# Patient Record
Sex: Male | Born: 1950 | Race: Black or African American | Hispanic: No | Marital: Married | Smoking: Never smoker
Health system: Southern US, Community
[De-identification: ages and names within clinical notes are randomized; demographics above are authoritative.]

## PROBLEM LIST (undated history)

## (undated) DIAGNOSIS — K7400 Hepatic fibrosis, unspecified: Secondary | ICD-10-CM

## (undated) DIAGNOSIS — F32A Depression, unspecified: Secondary | ICD-10-CM

## (undated) DIAGNOSIS — I10 Essential (primary) hypertension: Secondary | ICD-10-CM

## (undated) DIAGNOSIS — Z789 Other specified health status: Secondary | ICD-10-CM

## (undated) DIAGNOSIS — I639 Cerebral infarction, unspecified: Secondary | ICD-10-CM

## (undated) DIAGNOSIS — J45909 Unspecified asthma, uncomplicated: Secondary | ICD-10-CM

## (undated) DIAGNOSIS — Z860100 Personal history of colon polyps, unspecified: Secondary | ICD-10-CM

## (undated) DIAGNOSIS — I499 Cardiac arrhythmia, unspecified: Secondary | ICD-10-CM

## (undated) DIAGNOSIS — I4510 Unspecified right bundle-branch block: Secondary | ICD-10-CM

## (undated) DIAGNOSIS — G4733 Obstructive sleep apnea (adult) (pediatric): Secondary | ICD-10-CM

## (undated) DIAGNOSIS — M1990 Unspecified osteoarthritis, unspecified site: Secondary | ICD-10-CM

## (undated) DIAGNOSIS — K5790 Diverticulosis of intestine, part unspecified, without perforation or abscess without bleeding: Secondary | ICD-10-CM

## (undated) DIAGNOSIS — I77819 Aortic ectasia, unspecified site: Secondary | ICD-10-CM

## (undated) DIAGNOSIS — R7303 Prediabetes: Secondary | ICD-10-CM

## (undated) DIAGNOSIS — Z87898 Personal history of other specified conditions: Secondary | ICD-10-CM

## (undated) DIAGNOSIS — N200 Calculus of kidney: Secondary | ICD-10-CM

## (undated) DIAGNOSIS — R768 Other specified abnormal immunological findings in serum: Secondary | ICD-10-CM

## (undated) DIAGNOSIS — I69351 Hemiplegia and hemiparesis following cerebral infarction affecting right dominant side: Secondary | ICD-10-CM

## (undated) DIAGNOSIS — K649 Unspecified hemorrhoids: Secondary | ICD-10-CM

## (undated) HISTORY — PX: COLONOSCOPY: SHX174

---

## 1981-01-22 HISTORY — PX: OTHER SURGICAL HISTORY: SHX169

## 2001-10-12 ENCOUNTER — Encounter: Payer: Self-pay | Admitting: Emergency Medicine

## 2001-10-12 ENCOUNTER — Emergency Department (HOSPITAL_COMMUNITY): Admission: EM | Admit: 2001-10-12 | Discharge: 2001-10-12 | Payer: Self-pay | Admitting: Emergency Medicine

## 2004-09-24 ENCOUNTER — Emergency Department (HOSPITAL_COMMUNITY): Admission: EM | Admit: 2004-09-24 | Discharge: 2004-09-24 | Payer: Self-pay | Admitting: Emergency Medicine

## 2007-06-24 ENCOUNTER — Encounter: Admission: RE | Admit: 2007-06-24 | Discharge: 2007-06-24 | Payer: Self-pay | Admitting: Family Medicine

## 2007-07-08 ENCOUNTER — Emergency Department (HOSPITAL_COMMUNITY): Admission: EM | Admit: 2007-07-08 | Discharge: 2007-07-08 | Payer: Self-pay | Admitting: Emergency Medicine

## 2009-06-16 ENCOUNTER — Encounter: Admission: RE | Admit: 2009-06-16 | Discharge: 2009-06-16 | Payer: Self-pay | Admitting: Family Medicine

## 2011-03-22 ENCOUNTER — Emergency Department: Payer: Self-pay | Admitting: Emergency Medicine

## 2012-01-08 ENCOUNTER — Other Ambulatory Visit: Payer: Self-pay | Admitting: Family Medicine

## 2012-01-08 DIAGNOSIS — R51 Headache: Secondary | ICD-10-CM

## 2012-01-11 ENCOUNTER — Ambulatory Visit
Admission: RE | Admit: 2012-01-11 | Discharge: 2012-01-11 | Disposition: A | Discharge status: Home / Self Care | Payer: PRIVATE HEALTH INSURANCE | Source: Ambulatory Visit | Attending: Family Medicine | Admitting: Family Medicine

## 2012-01-11 DIAGNOSIS — R51 Headache: Secondary | ICD-10-CM

## 2014-01-07 ENCOUNTER — Emergency Department (HOSPITAL_COMMUNITY)
Admission: EM | Admit: 2014-01-07 | Discharge: 2014-01-08 | Disposition: A | Discharge status: Home / Self Care | Payer: PRIVATE HEALTH INSURANCE | Attending: Emergency Medicine | Admitting: Emergency Medicine

## 2014-01-07 ENCOUNTER — Encounter (HOSPITAL_COMMUNITY): Payer: Self-pay | Admitting: Emergency Medicine

## 2014-01-07 DIAGNOSIS — Y9389 Activity, other specified: Secondary | ICD-10-CM | POA: Diagnosis not present

## 2014-01-07 DIAGNOSIS — Y92410 Unspecified street and highway as the place of occurrence of the external cause: Secondary | ICD-10-CM | POA: Insufficient documentation

## 2014-01-07 DIAGNOSIS — S39012A Strain of muscle, fascia and tendon of lower back, initial encounter: Secondary | ICD-10-CM | POA: Insufficient documentation

## 2014-01-07 DIAGNOSIS — S79912A Unspecified injury of left hip, initial encounter: Secondary | ICD-10-CM | POA: Diagnosis not present

## 2014-01-07 DIAGNOSIS — Y998 Other external cause status: Secondary | ICD-10-CM | POA: Insufficient documentation

## 2014-01-07 DIAGNOSIS — I10 Essential (primary) hypertension: Secondary | ICD-10-CM | POA: Insufficient documentation

## 2014-01-07 DIAGNOSIS — S3992XA Unspecified injury of lower back, initial encounter: Secondary | ICD-10-CM | POA: Diagnosis present

## 2014-01-07 HISTORY — DX: Essential (primary) hypertension: I10
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
## 2014-01-07 NOTE — ED Notes (Addendum)
Denis Neck pain.  NO c-collar in place

## 2014-01-08 ENCOUNTER — Emergency Department (HOSPITAL_COMMUNITY): Payer: PRIVATE HEALTH INSURANCE

## 2014-01-08 DIAGNOSIS — S39012A Strain of muscle, fascia and tendon of lower back, initial encounter: Secondary | ICD-10-CM | POA: Diagnosis not present

## 2014-01-08 MED ORDER — CYCLOBENZAPRINE HCL 10 MG PO TABS: 10.0000 mg | ORAL_TABLET | Freq: Two times a day (BID) | ORAL | Status: DC | PRN

## 2014-01-08 MED ORDER — NAPROXEN 500 MG PO TABS: 500.0000 mg | ORAL_TABLET | Freq: Two times a day (BID) | ORAL | Status: DC

## 2014-01-08 MED ORDER — TRAMADOL HCL 50 MG PO TABS: 50.0000 mg | ORAL_TABLET | Freq: Four times a day (QID) | ORAL | Status: DC | PRN

## 2014-01-08 MED ORDER — IBUPROFEN 400 MG PO TABS
800.0000 mg | ORAL_TABLET | Freq: Once | ORAL | Status: AC
  Administered 2014-01-08: 800 mg via ORAL
  Filled 2014-01-08: qty 2
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
## 2015-12-02 ENCOUNTER — Other Ambulatory Visit (HOSPITAL_COMMUNITY): Payer: Self-pay | Admitting: Nurse Practitioner

## 2015-12-02 DIAGNOSIS — B182 Chronic viral hepatitis C: Secondary | ICD-10-CM

## 2015-12-14 ENCOUNTER — Ambulatory Visit (HOSPITAL_COMMUNITY)
Admission: RE | Admit: 2015-12-14 | Discharge: 2015-12-14 | Disposition: A | Discharge status: Home / Self Care | Payer: No Typology Code available for payment source | Source: Ambulatory Visit | Attending: Nurse Practitioner | Admitting: Nurse Practitioner

## 2015-12-14 DIAGNOSIS — B182 Chronic viral hepatitis C: Secondary | ICD-10-CM

## 2016-01-02 ENCOUNTER — Ambulatory Visit (HOSPITAL_COMMUNITY): Payer: No Typology Code available for payment source

## 2016-01-11 ENCOUNTER — Ambulatory Visit (HOSPITAL_COMMUNITY)
Admission: RE | Admit: 2016-01-11 | Discharge: 2016-01-11 | Disposition: A | Discharge status: Home / Self Care | Payer: No Typology Code available for payment source | Source: Ambulatory Visit | Attending: Nurse Practitioner | Admitting: Nurse Practitioner

## 2016-01-11 DIAGNOSIS — N200 Calculus of kidney: Secondary | ICD-10-CM | POA: Insufficient documentation

## 2016-01-11 DIAGNOSIS — B182 Chronic viral hepatitis C: Secondary | ICD-10-CM | POA: Diagnosis present

## 2016-08-07 ENCOUNTER — Other Ambulatory Visit: Payer: Self-pay | Admitting: Nurse Practitioner

## 2016-08-07 DIAGNOSIS — K7400 Hepatic fibrosis, unspecified: Secondary | ICD-10-CM

## 2016-11-20 ENCOUNTER — Other Ambulatory Visit: Payer: Self-pay | Admitting: Nurse Practitioner

## 2016-11-20 DIAGNOSIS — K7400 Hepatic fibrosis, unspecified: Secondary | ICD-10-CM

## 2016-11-27 ENCOUNTER — Ambulatory Visit
Admission: RE | Admit: 2016-11-27 | Discharge: 2016-11-27 | Disposition: A | Discharge status: Home / Self Care | Payer: No Typology Code available for payment source | Source: Ambulatory Visit | Attending: Nurse Practitioner | Admitting: Nurse Practitioner

## 2016-11-27 DIAGNOSIS — K7400 Hepatic fibrosis, unspecified: Secondary | ICD-10-CM

## 2017-02-11 ENCOUNTER — Other Ambulatory Visit: Payer: Self-pay | Admitting: Nurse Practitioner

## 2017-02-11 DIAGNOSIS — B182 Chronic viral hepatitis C: Secondary | ICD-10-CM

## 2017-02-18 ENCOUNTER — Ambulatory Visit
Admission: RE | Admit: 2017-02-18 | Discharge: 2017-02-18 | Disposition: A | Discharge status: Home / Self Care | Payer: No Typology Code available for payment source | Source: Ambulatory Visit | Attending: Nurse Practitioner | Admitting: Nurse Practitioner

## 2017-02-18 DIAGNOSIS — B182 Chronic viral hepatitis C: Secondary | ICD-10-CM

## 2017-02-25 ENCOUNTER — Ambulatory Visit
Admission: RE | Admit: 2017-02-25 | Discharge: 2017-02-25 | Disposition: A | Discharge status: Home / Self Care | Payer: No Typology Code available for payment source | Source: Ambulatory Visit | Attending: Nurse Practitioner | Admitting: Nurse Practitioner

## 2017-02-25 DIAGNOSIS — B182 Chronic viral hepatitis C: Secondary | ICD-10-CM

## 2017-03-29 ENCOUNTER — Emergency Department (HOSPITAL_COMMUNITY)
Admission: EM | Admit: 2017-03-29 | Discharge: 2017-03-29 | Disposition: A | Discharge status: Home / Self Care | Payer: No Typology Code available for payment source | Attending: Emergency Medicine | Admitting: Emergency Medicine

## 2017-03-29 ENCOUNTER — Other Ambulatory Visit: Payer: Self-pay

## 2017-03-29 ENCOUNTER — Emergency Department (HOSPITAL_COMMUNITY): Payer: No Typology Code available for payment source

## 2017-03-29 ENCOUNTER — Encounter (HOSPITAL_COMMUNITY): Payer: Self-pay

## 2017-03-29 DIAGNOSIS — M79662 Pain in left lower leg: Secondary | ICD-10-CM | POA: Insufficient documentation

## 2017-03-29 DIAGNOSIS — I10 Essential (primary) hypertension: Secondary | ICD-10-CM | POA: Diagnosis not present

## 2017-03-29 DIAGNOSIS — M79605 Pain in left leg: Secondary | ICD-10-CM

## 2017-03-29 MED ORDER — DICLOFENAC SODIUM 75 MG PO TBEC: 75.0000 mg | DELAYED_RELEASE_TABLET | Freq: Two times a day (BID) | ORAL | 0 refills | Status: DC

## 2017-03-29 NOTE — ED Provider Notes (Signed)
[facility]   CSN: [id number] Arrival date & time: 03/29/17  1750     History   Chief Complaint Chief Complaint  Patient presents with  . Leg Pain    HPI Ethan Reed is a 67 y.o. male.  The history is provided by the patient.  Leg Pain   This is a new problem. The current episode started more than 1 week ago. The problem occurs constantly. The problem has been gradually worsening. The pain is present in the left lower leg. The quality of the pain is described as aching. Pertinent negatives include full range of motion. He has tried nothing for the symptoms. There has been no history of extremity trauma.  Pt complains of soreness in the front area of his lower leg.  Pt saw urgent care yesterday.  Pt given rx for meloxicam.  Pt was told he had muscle inflammation.    Past Medical History:  Diagnosis Date  . Hypertension     There are no active problems to display for this patient.   History reviewed. No pertinent surgical history.     Home Medications    Prior to Admission medications   Medication Sig Start Date End Date Taking? Authorizing Provider  cyclobenzaprine (FLEXERIL) 10 MG tablet Take 1 tablet (10 mg total) by mouth 2 (two) times daily as needed for muscle spasms. 01/08/14   Arthor CaptainHarris, Abigail, PA-C  diclofenac (VOLTAREN) 75 MG EC tablet Take 1 tablet (75 mg total) by mouth 2 (two) times daily. 03/29/17   Elson AreasSofia, Trenika Hudson K, PA-C  naproxen (NAPROSYN) 500 MG tablet Take 1 tablet (500 mg total) by mouth 2 (two) times daily with a meal. 01/08/14   Harris, Abigail, PA-C  traMADol (ULTRAM) 50 MG tablet Take 1 tablet (50 mg total) by mouth every 6 (six) hours as needed. 01/08/14   Arthor CaptainHarris, Abigail, PA-C    Family History History reviewed. No pertinent family history.  Social History Social History   Tobacco Use  . Smoking status: Never Smoker  . Smokeless tobacco: Never Used  Substance Use Topics  . Alcohol  use: No  . Drug use: No     Allergies   Oxycodone   Review of Systems Review of Systems  All other systems reviewed and are negative.    Physical Exam Updated Vital Signs BP (!) 144/91 (BP Location: Right Arm)   Pulse 73   Temp 98.1 F (36.7 C) (Oral)   Ht 6' (1.829 m)   Wt 88 kg (194 lb)   SpO2 98%   BMI 26.31 kg/m   Physical Exam  Constitutional: He appears well-developed and well-nourished.  HENT:  Head: Normocephalic.  Cardiovascular: Normal rate.  Pulmonary/Chest: Effort normal.  Musculoskeletal: He exhibits tenderness. He exhibits no edema or deformity.  Tender anterior shin,  No swelling, no bruising.  Negative homan's.    Neurological: He is alert.  Skin: Skin is warm.  Psychiatric: He has a normal mood and affect.  Nursing note and vitals reviewed.    ED Treatments / Results  Labs (all labs ordered are listed, but only abnormal results are displayed) Labs Reviewed - No data to display  EKG  EKG Interpretation None       Radiology Dg Tibia/fibula Left  Result Date: 03/29/2017 CLINICAL DATA:  Pt is having pain in left tib/fib, on lateral side from the ankle up to the knee. EXAM: LEFT TIBIA AND FIBULA - 2 VIEW COMPARISON:  None. FINDINGS: No  fracture. There is a small osteochondroma projecting from the posteromedial aspect of the proximal fibular metaphysis. No other bone lesions. There is medial joint space compartment narrowing at the knee consistent with mild osteoarthritis. The ankle joint is normally spaced and aligned. Soft tissues are unremarkable. IMPRESSION: 1. No fracture or acute finding. 2. Small proximal fibular osteochondroma. 3. Mild knee joint osteoarthritis. Electronically Signed   By: Amie Portland M.D.   On: 03/29/2017 19:23    Procedures Procedures (including critical care time)  Medications Ordered in ED Medications - No data to display   Initial Impression / Assessment and Plan / ED Course  I have reviewed the triage vital  signs and the nursing notes.  Pertinent labs & imaging results that were available during my care of the patient were reviewed by me and considered in my medical decision making (see chart for details).     MDM  xrays reviewed with pt,  I will try voltaren as meloxicam gave limited relief.  Pt advised to follow up with Orthopaedist for evalaution if pain persist.    Final Clinical Impressions(s) / ED Diagnoses   Final diagnoses:  Left leg pain    ED Discharge Orders        Ordered    diclofenac (VOLTAREN) 75 MG EC tablet  2 times daily     03/29/17 2013    An After Visit Summary was printed and given to the patient.    Elson Areas, PA-C 03/29/17 2247    Charlynne Pander, MD 04/01/17 [phone]

## 2017-03-29 NOTE — ED Notes (Signed)
Pt verbalizes understanding of d/c instructions. Pt received prescriptions. Pt ambulatory at d/c with all belongings.  

## 2017-03-29 NOTE — ED Notes (Signed)
See EDP assessment 

## 2017-11-05 ENCOUNTER — Ambulatory Visit
Admission: RE | Admit: 2017-11-05 | Discharge: 2017-11-05 | Disposition: A | Discharge status: Home / Self Care | Payer: No Typology Code available for payment source | Source: Ambulatory Visit | Attending: Family Medicine | Admitting: Family Medicine

## 2017-11-05 ENCOUNTER — Other Ambulatory Visit: Payer: Self-pay | Admitting: Family Medicine

## 2017-11-05 DIAGNOSIS — T1490XA Injury, unspecified, initial encounter: Secondary | ICD-10-CM

## 2018-01-22 DIAGNOSIS — I639 Cerebral infarction, unspecified: Secondary | ICD-10-CM

## 2018-01-22 HISTORY — DX: Cerebral infarction, unspecified: I63.9

## 2018-02-14 ENCOUNTER — Emergency Department (HOSPITAL_COMMUNITY): Payer: No Typology Code available for payment source

## 2018-02-14 ENCOUNTER — Observation Stay (HOSPITAL_COMMUNITY): Payer: No Typology Code available for payment source

## 2018-02-14 ENCOUNTER — Observation Stay (HOSPITAL_COMMUNITY)
Admission: EM | Admit: 2018-02-14 | Discharge: 2018-02-15 | Disposition: A | Discharge status: Home / Self Care | Payer: No Typology Code available for payment source | Attending: Internal Medicine | Admitting: Internal Medicine

## 2018-02-14 ENCOUNTER — Other Ambulatory Visit: Payer: Self-pay

## 2018-02-14 DIAGNOSIS — R0602 Shortness of breath: Secondary | ICD-10-CM | POA: Diagnosis not present

## 2018-02-14 DIAGNOSIS — I10 Essential (primary) hypertension: Secondary | ICD-10-CM

## 2018-02-14 DIAGNOSIS — I63332 Cerebral infarction due to thrombosis of left posterior cerebral artery: Secondary | ICD-10-CM

## 2018-02-14 DIAGNOSIS — E785 Hyperlipidemia, unspecified: Secondary | ICD-10-CM | POA: Insufficient documentation

## 2018-02-14 DIAGNOSIS — R531 Weakness: Secondary | ICD-10-CM

## 2018-02-14 DIAGNOSIS — R202 Paresthesia of skin: Secondary | ICD-10-CM

## 2018-02-14 DIAGNOSIS — Z79899 Other long term (current) drug therapy: Secondary | ICD-10-CM | POA: Insufficient documentation

## 2018-02-14 DIAGNOSIS — R200 Anesthesia of skin: Secondary | ICD-10-CM | POA: Diagnosis present

## 2018-02-14 DIAGNOSIS — G459 Transient cerebral ischemic attack, unspecified: Principal | ICD-10-CM

## 2018-02-14 HISTORY — DX: Other specified health status: Z78.9

## 2018-02-14 HISTORY — DX: Cardiac arrhythmia, unspecified: I49.9

## 2018-02-14 HISTORY — DX: Cerebral infarction, unspecified: I63.9

## 2018-02-14 LAB — CBC
HCT: 49 % (ref 39.0–52.0)
Hemoglobin: 16.3 g/dL (ref 13.0–17.0)
MCH: 30.4 pg (ref 26.0–34.0)
MCHC: 33.3 g/dL (ref 30.0–36.0)
MCV: 91.4 fL (ref 80.0–100.0)
NRBC: 0 % (ref 0.0–0.2)
PLATELETS: 184 10*3/uL (ref 150–400)
RBC: 5.36 MIL/uL (ref 4.22–5.81)
RDW: 12.5 % (ref 11.5–15.5)
WBC: 5.3 10*3/uL (ref 4.0–10.5)

## 2018-02-14 LAB — PROTIME-INR
INR: 1.02
Prothrombin Time: 13.3 seconds (ref 11.4–15.2)

## 2018-02-14 LAB — URINALYSIS, ROUTINE W REFLEX MICROSCOPIC
BACTERIA UA: NONE SEEN
Bilirubin Urine: NEGATIVE
GLUCOSE, UA: NEGATIVE mg/dL
Ketones, ur: NEGATIVE mg/dL
Leukocytes, UA: NEGATIVE
Nitrite: NEGATIVE
Protein, ur: NEGATIVE mg/dL
SPECIFIC GRAVITY, URINE: 1.012 (ref 1.005–1.030)
pH: 8 (ref 5.0–8.0)

## 2018-02-14 LAB — RAPID URINE DRUG SCREEN, HOSP PERFORMED
AMPHETAMINES: NOT DETECTED
Barbiturates: NOT DETECTED
Benzodiazepines: NOT DETECTED
Cocaine: NOT DETECTED
Opiates: NOT DETECTED
Tetrahydrocannabinol: NOT DETECTED

## 2018-02-14 LAB — DIFFERENTIAL
Abs Immature Granulocytes: 0.02 10*3/uL (ref 0.00–0.07)
Basophils Absolute: 0 10*3/uL (ref 0.0–0.1)
Basophils Relative: 1 %
Eosinophils Absolute: 0.1 10*3/uL (ref 0.0–0.5)
Eosinophils Relative: 1 %
Immature Granulocytes: 0 %
Lymphocytes Relative: 22 %
Lymphs Abs: 1.2 10*3/uL (ref 0.7–4.0)
Monocytes Absolute: 0.4 10*3/uL (ref 0.1–1.0)
Monocytes Relative: 8 %
Neutro Abs: 3.6 10*3/uL (ref 1.7–7.7)
Neutrophils Relative %: 68 %

## 2018-02-14 LAB — COMPREHENSIVE METABOLIC PANEL
ALT: 23 U/L (ref 0–44)
AST: 18 U/L (ref 15–41)
Albumin: 3.8 g/dL (ref 3.5–5.0)
Alkaline Phosphatase: 56 U/L (ref 38–126)
Anion gap: 10 (ref 5–15)
BUN: 12 mg/dL (ref 8–23)
CO2: 23 mmol/L (ref 22–32)
Calcium: 9.2 mg/dL (ref 8.9–10.3)
Chloride: 106 mmol/L (ref 98–111)
Creatinine, Ser: 0.93 mg/dL (ref 0.61–1.24)
GFR calc Af Amer: >60 mL/min (ref >60)
GFR calc non Af Amer: >60 mL/min (ref >60)
Glucose, Bld: 103 mg/dL — ABNORMAL HIGH (ref 70–99)
POTASSIUM: 3.6 mmol/L (ref 3.5–5.1)
SODIUM: 139 mmol/L (ref 135–145)
Total Bilirubin: 0.9 mg/dL (ref 0.3–1.2)
Total Protein: 7.6 g/dL (ref 6.5–8.1)

## 2018-02-14 LAB — I-STAT TROPONIN, ED: Troponin i, poc: 0 ng/mL (ref 0.00–0.08)

## 2018-02-14 LAB — ETHANOL: Alcohol, Ethyl (B): <10 mg/dL (ref <10)

## 2018-02-14 LAB — APTT: aPTT: 28 seconds (ref 24–36)

## 2018-02-14 MED ORDER — ACETAMINOPHEN 650 MG RE SUPP: 650.0000 mg | RECTAL | Status: DC | PRN

## 2018-02-14 MED ORDER — ACETAMINOPHEN 325 MG PO TABS: 650.0000 mg | ORAL_TABLET | ORAL | Status: DC | PRN

## 2018-02-14 MED ORDER — STROKE: EARLY STAGES OF RECOVERY BOOK
Freq: Once | Status: AC
  Administered 2018-02-14: 21:00:00
  Filled 2018-02-14: qty 1

## 2018-02-14 MED ORDER — ACETAMINOPHEN 160 MG/5ML PO SOLN: 650.0000 mg | ORAL | Status: DC | PRN

## 2018-02-14 MED ORDER — IOPAMIDOL (ISOVUE-370) INJECTION 76%
INTRAVENOUS | Status: AC
  Filled 2018-02-14: qty 100

## 2018-02-14 MED ORDER — HYDRALAZINE HCL 20 MG/ML IJ SOLN: 10.0000 mg | Freq: Four times a day (QID) | INTRAMUSCULAR | Status: DC | PRN

## 2018-02-14 MED ORDER — IOPAMIDOL (ISOVUE-370) INJECTION 76%
100.0000 mL | Freq: Once | INTRAVENOUS | Status: AC | PRN
  Administered 2018-02-14: 100 mL via INTRAVENOUS

## 2018-02-14 MED ORDER — ASPIRIN EC 325 MG PO TBEC
325.0000 mg | DELAYED_RELEASE_TABLET | Freq: Every day | ORAL | Status: DC
  Administered 2018-02-14: 325 mg via ORAL
  Filled 2018-02-14: qty 1

## 2018-02-14 MED ORDER — SENNOSIDES-DOCUSATE SODIUM 8.6-50 MG PO TABS: 1.0000 | ORAL_TABLET | Freq: Every evening | ORAL | Status: DC | PRN

## 2018-02-14 NOTE — Consult Note (Addendum)
Neurology Consultation  Reason for Consult: TIA Referring Physician: Dr. Catha GosselinMikhail  CC: Right face and arm tingling and numbness  History is obtained from: Patient and wife  HPI: Ethan EvansWilliam B Reed is a 68 y.o. left-handed male past medical history of hypertension, hyperlipidemia, occasionally compliant with his hypertensive medications, presented to the emergency room for evaluation of numbness and also possible weakness in the right arm that started upon waking up this morning. Best last known normal is around 9 AM but symptoms completely resolving and coming back around. He describes that he woke up this morning, was feeling okay and then started to feel somewhat not like himself which he describes as feeling dizzy and just not right. The dizziness he describes as being lightheadedness that lasted for a few minutes and then resolved.  He also noted at the time that he was having difficulty using his right arm.  His right arm felt lighter than usual and he could not coordinate the movements as well. The symptoms nearly completely resolved and then recurred.  The first time the symptoms lasted for about 25 minutes and then completely resolved.  Again the symptoms started happening and lasted for about 2030 minutes and then completely resolved.  During the time of this encounter, he was complaining of having the symptoms again and my exam as documented below- low NIH stroke scale at most 2 for ataxia hence not a candidate for any acute intervention including TPA. On review of systems, he denies any shortness of breath chest pain nausea vomiting bleeding or bruising tendencies.  He denies any preceding illnesses sicknesses fevers chills.  Initially denied anxiety but then later on after my exam was completed, his wife and him both asked that the symptoms be coming from anxiety as he is under some personal stressors, which are nearly the same as his usual stressors.  He has been more concerned with them  then usual.  LKW: 9 AM 02/14/2017 tpa given?: no, NIHSS 2 at best, waxing waning symptoms Premorbid modified Rankin scale (mRS): 0  ROS: ROS was performed and is negative except as noted in the HPI.  Past Medical History:  Diagnosis Date  . Hypertension    No family history on file.  Social History:   reports that he has never smoked. He has never used smokeless tobacco. He reports that he does not drink alcohol or use drugs.  Medications No current facility-administered medications for this encounter.   Current Outpatient Medications:  .  cyclobenzaprine (FLEXERIL) 10 MG tablet, Take 1 tablet (10 mg total) by mouth 2 (two) times daily as needed for muscle spasms., Disp: 20 tablet, Rfl: 0 .  diclofenac (VOLTAREN) 75 MG EC tablet, Take 1 tablet (75 mg total) by mouth 2 (two) times daily., Disp: 20 tablet, Rfl: 0 .  naproxen (NAPROSYN) 500 MG tablet, Take 1 tablet (500 mg total) by mouth 2 (two) times daily with a meal., Disp: 30 tablet, Rfl: 0 .  traMADol (ULTRAM) 50 MG tablet, Take 1 tablet (50 mg total) by mouth every 6 (six) hours as needed., Disp: 15 tablet, Rfl: 0  Exam: Current vital signs: BP (!) 165/101   Pulse 91   Temp 98 F (36.7 C) (Oral)   Resp 12   Ht 6' (1.829 m)   Wt 86.2 kg   SpO2 96%   BMI 25.77 kg/m  Vital signs in last 24 hours: Temp:  [98 F (36.7 C)] 98 F (36.7 C) (01/24 1259) Pulse Rate:  [91-95] 91 (  01/24 1500) Resp:  [12-16] 12 (01/24 1500) BP: (165)/(101) 165/101 (01/24 1300) SpO2:  [96 %-97 %] 96 % (01/24 1500) Weight:  [86.2 kg] 86.2 kg (01/24 1256) GENERAL: Awake, alert in NAD HEENT: - Normocephalic and atraumatic, dry mm, no LN++, no Thyromegally LUNGS - Clear to auscultation bilaterally with no wheezes CV - S1S2 RRR, no m/r/g, equal pulses bilaterally. ABDOMEN - Soft, nontender, nondistended with normoactive BS Ext: warm, well perfused, intact peripheral pulses,no edema NEURO:  Mental Status: AA&Ox3  Language: speech is not  dysarthric.  Naming, repetition, fluency, and comprehension intact. Cranial Nerves: PERRL EOMI, visual fields full, no facial asymmetry,facial sensation - decreased to LT on right lower face, hearing intact, tongue/uvula/soft palate midline, normal sternocleidomastoid and trapezius muscle strength. No evidence of tongue atrophy or fibrillations Motor: 5/5 all over with no drift Tone: is normal and bulk is normal Sensation- Intact to light touch bilaterally Coordination: FTN shows ataxia on right, possibly mildly ataxic on right HKS also. No dysmetria on left side Gait- deferred  NIHSS-2 at this time   Labs I have reviewed labs in epic and the results pertinent to this consultation are:  CBC    Component Value Date/Time   WBC 5.3 02/14/2018 1443   RBC 5.36 02/14/2018 1443   HGB 16.3 02/14/2018 1443   HCT 49.0 02/14/2018 1443   PLT 184 02/14/2018 1443   MCV 91.4 02/14/2018 1443   MCH 30.4 02/14/2018 1443   MCHC 33.3 02/14/2018 1443   RDW 12.5 02/14/2018 1443   LYMPHSABS 1.2 02/14/2018 1443   MONOABS 0.4 02/14/2018 1443   EOSABS 0.1 02/14/2018 1443   BASOSABS 0.0 02/14/2018 1443    CMP     Component Value Date/Time   NA 139 02/14/2018 1443   K 3.6 02/14/2018 1443   CL 106 02/14/2018 1443   CO2 23 02/14/2018 1443   GLUCOSE 103 (H) 02/14/2018 1443   BUN 12 02/14/2018 1443   CREATININE 0.93 02/14/2018 1443   CALCIUM 9.2 02/14/2018 1443   PROT 7.6 02/14/2018 1443   ALBUMIN 3.8 02/14/2018 1443   AST 18 02/14/2018 1443   ALT 23 02/14/2018 1443   ALKPHOS 56 02/14/2018 1443   BILITOT 0.9 02/14/2018 1443   GFRNONAA >60 02/14/2018 1443   GFRAA >60 02/14/2018 1443   Imaging I have reviewed the images obtained: CT-scan of the brain-noa cute changes. No bleed.  Assessment: 68 year old left-handed man with history of hypertension hyperlipidemia noncompliant to medications presented for evaluation and numbness and possible weakness in the right arm and numbness of the right face  that has been going off and on since 9 AM. His NIH stroke scale for me was 2.  Mild symptoms hence no TPA warranted or offered. No cortical signs on exam.  Not a candidate for EVT. Differentials due to the risk factors include evaluate for stroke/crescendo TIA versus anxiety (both patient and wife volunteered that there are some extenuating stressors both personally and workwise) Given his risk factors, I would evaluate him for stroke/TIA. On my initial conversation with the ED provider/internal medicine attending, I had recommended getting an EEG but this does not seem to be an electrographic phenomenon and I would not pursue a seizure work-up at this time.  Impression:  Evaluate for stroke/TIA Anxiety  Recommendations: -Telemetry -Frequent neurochecks -Allow for permissive hypertension treating on the systolic blood pressures more than 220. -Aspirin 325 -Atorvastatin 80 -2D echo -A1c -Lipid panel -MRI brain -CTA head and neck -PT -OT -Speech therapy  For  anxiety, recommended that he speak with primary care and get help as deemed appropriate.  He did not feel comfortable to discuss with me the details of his stressors at this time.  Please page stroke NP/PA/MD (listed on AMION)  from 8am-4 pm as this patient will be followed by the stroke team at this point.  -- Milon Dikes, MD [facility] Pager: [phone] If 7pm to 7am, please call on call as listed on AMION.

## 2018-02-14 NOTE — Progress Notes (Signed)
Patient arrived to unit, VSS, NAD noted, MDl notified, no new orders received. PIV access obtained for CTA, pt ok to transport w/o RN per Dr. Catha Gosselin.

## 2018-02-14 NOTE — ED Provider Notes (Signed)
[name] Provider Note   CSN: [id number] Arrival date & time: 02/14/18  1248     History   Chief Complaint Chief Complaint  Patient presents with  . Right Arm Numbness    HPI Ethan EvansWilliam B Reed is a 68 y.o. male with a PMHx of HTN, who presents to the ED with complaints of right facial and arm numbness and tingling intermittently since 11 AM.  Patient states he has brief episodes lasting a few minutes where his right mouth will feel tingly and numb as well as his right hand, these are intermittent.  He states that around 11 AM when it started he had right arm and leg weakness/discoordination that was also intermittent.  He states he had some difficulty with his speech, felt that it was slurred, and also felt dizzy.  These all are intermittent "episodes" and last only a few minutes before self resolving.  No known aggravating or alleviating factors, no treatments were tried.  He states that he is "breathing a little heavy" and feels "a little" short of breath.  Lastly he mentions that for this last week he has had some diarrhea, nonbloody, but none today.  He mentions that he has not taken his blood pressure medication today, takes lisinopril-HCTZ 10-12.5 mg, but admits that he has not been taking it as prescribed every day.  His PCP is Dr. [name] at [facility].  He denies any facial droop, fevers, chills, cough, chest pain, leg swelling, abdominal pain, nausea, vomiting, constipation, melena, hematochezia, dysuria, hematuria, headache, vision changes, diaphoresis, or any other complaints at this time.  The history is provided by the patient and medical records. No language interpreter was used.    Past Medical History:  Diagnosis Date  . Hypertension     There are no active problems to display for this patient.   No past surgical history on file.      Home Medications    Prior to Admission medications   Medication Sig Start Date  End Date Taking? Authorizing Provider  cyclobenzaprine (FLEXERIL) 10 MG tablet Take 1 tablet (10 mg total) by mouth 2 (two) times daily as needed for muscle spasms. 01/08/14   Arthor CaptainHarris, Abigail, PA-C  diclofenac (VOLTAREN) 75 MG EC tablet Take 1 tablet (75 mg total) by mouth 2 (two) times daily. 03/29/17   Elson AreasSofia, Leslie K, PA-C  naproxen (NAPROSYN) 500 MG tablet Take 1 tablet (500 mg total) by mouth 2 (two) times daily with a meal. 01/08/14   Harris, Abigail, PA-C  traMADol (ULTRAM) 50 MG tablet Take 1 tablet (50 mg total) by mouth every 6 (six) hours as needed. 01/08/14   Arthor CaptainHarris, Abigail, PA-C    Family History No family history on file.  Social History Social History   Tobacco Use  . Smoking status: Never Smoker  . Smokeless tobacco: Never Used  Substance Use Topics  . Alcohol use: No  . Drug use: No     Allergies   Oxycodone   Review of Systems Review of Systems  Constitutional: Negative for chills, diaphoresis and fever.  Eyes: Negative for visual disturbance.  Respiratory: Positive for shortness of breath. Negative for cough.   Cardiovascular: Negative for chest pain and leg swelling.  Gastrointestinal: Positive for diarrhea (past week, none today). Negative for abdominal pain, blood in stool, constipation, nausea and vomiting.  Genitourinary: Negative for dysuria and hematuria.  Musculoskeletal: Negative for arthralgias and myalgias.  Skin: Negative for color change.  Allergic/Immunologic: Negative for  immunocompromised state.  Neurological: Positive for dizziness, speech difficulty, weakness and numbness. Negative for headaches.  Psychiatric/Behavioral: Negative for confusion.   All other systems reviewed and are negative for acute change except as noted in the HPI.    Physical Exam Updated Vital Signs BP (!) 165/101   Pulse 95   Temp 98 F (36.7 C) (Oral)   Resp 16   Ht 6' (1.829 m)   Wt 86.2 kg   SpO2 97%   BMI 25.77 kg/m   Physical Exam Vitals signs and  nursing note reviewed.  Constitutional:      General: He is not in acute distress.    Appearance: Normal appearance. He is well-developed. He is not toxic-appearing.     Comments: Afebrile, nontoxic, NAD  HENT:     Head: Normocephalic and atraumatic.  Eyes:     General: Vision grossly intact.        Right eye: No discharge.        Left eye: No discharge.     Extraocular Movements: Extraocular movements intact.     Conjunctiva/sclera: Conjunctivae normal.     Pupils: Pupils are equal, round, and reactive to light.     Comments: PERRL, EOMI, no nystagmus  Neck:     Musculoskeletal: Normal range of motion and neck supple. Normal range of motion. No neck rigidity, spinous process tenderness or muscular tenderness.     Comments: FROM intact without spinous process TTP, no bony stepoffs or deformities, no paraspinous muscle TTP or muscle spasms. No rigidity or meningeal signs. No bruising or swelling.  Cardiovascular:     Rate and Rhythm: Normal rate and regular rhythm.     Pulses: Normal pulses.     Heart sounds: Normal heart sounds, S1 normal and S2 normal. No murmur. No friction rub. No gallop.   Pulmonary:     Effort: Pulmonary effort is normal. No respiratory distress.     Breath sounds: Normal breath sounds. No decreased breath sounds, wheezing, rhonchi or rales.     Comments: CTAB in all lung fields, no w/r/r, no hypoxia or increased WOB, speaking in full sentences, SpO2 96% on RA Abdominal:     General: Bowel sounds are normal. There is no distension.     Palpations: Abdomen is soft. Abdomen is not rigid.     Tenderness: There is no abdominal tenderness. There is no right CVA tenderness, left CVA tenderness, guarding or rebound. Negative signs include Murphy's sign and McBurney's sign.  Musculoskeletal: Normal range of motion.     Comments: MAE x4 Strength and sensation grossly intact in all extremities Distal pulses intact Gait steady No pedal edema  Skin:    General: Skin is  warm and dry.     Findings: No rash.  Neurological:     General: No focal deficit present.     Mental Status: He is alert and oriented to person, place, and time.     GCS: GCS eye subscore is 4. GCS verbal subscore is 5. GCS motor subscore is 6.     Cranial Nerves: Cranial nerves are intact. No cranial nerve deficit.     Sensory: Sensation is intact. No sensory deficit.     Motor: Motor function is intact.     Coordination: Coordination is intact. Coordination normal.     Gait: Gait normal.     Comments: CN 2-12 grossly intact A&O x4 GCS 15 Sensation and strength intact Gait nonataxic including with tandem walking Coordination with finger-to-nose WNL Neg pronator  drift   Psychiatric:        Mood and Affect: Mood and affect normal.        Behavior: Behavior normal.      ED Treatments / Results  Labs (all labs ordered are listed, but only abnormal results are displayed) Labs Reviewed  COMPREHENSIVE METABOLIC PANEL - Abnormal; Notable for the following components:      Result Value   Glucose, Bld 103 (*)    All other components within normal limits  ETHANOL  PROTIME-INR  APTT  CBC  DIFFERENTIAL  RAPID URINE DRUG SCREEN, HOSP PERFORMED  URINALYSIS, ROUTINE W REFLEX MICROSCOPIC  I-STAT TROPONIN, ED    EKG EKG Interpretation  Date/Time:  Friday February 14 2018 14:55:56 EST Ventricular Rate:  90 PR Interval:    QRS Duration: 108 QT Interval:  355 QTC Calculation: 435 R Axis:   84 Text Interpretation:  Sinus rhythm Consider right ventricular hypertrophy No old tracing to compare Confirmed by Raeford Razor [phone]) on 02/14/2018 3:11:46 PM   Radiology Dg Chest 2 View  Result Date: 02/14/2018 CLINICAL DATA:  Lip numbness and right hand and leg numbness with discoordination of the right hand today. EXAM: CHEST - 2 VIEW COMPARISON:  None. FINDINGS: Normal sized heart. Tortuous aorta. The lungs are hyperexpanded with mild peribronchial thickening. Thoracic spine  degenerative changes. IMPRESSION: No acute abnormality. Mild changes of COPD and chronic bronchitis. Electronically Signed   By: Beckie Salts M.D.   On: 02/14/2018 15:43   Ct Head Wo Contrast  Result Date: 02/14/2018 CLINICAL DATA:  Shortness of breath and intermittent numbness and tingling in right arm. EXAM: CT HEAD WITHOUT CONTRAST TECHNIQUE: Contiguous axial images were obtained from the base of the skull through the vertex without intravenous contrast. COMPARISON:  01/11/2012 FINDINGS: Brain: The ventricles are normal in size and configuration. No extra-axial fluid collections are identified. The gray-white differentiation is maintained. No CT findings for acute hemispheric infarction or intracranial hemorrhage. No mass lesions. The brainstem and cerebellum are normal. Vascular: No hyperdense vessels or obvious aneurysm. Skull: No acute skull fracture.  No bone lesion. Sinuses/Orbits: The paranasal sinuses and mastoid air cells are clear. The globes are intact. Other: No scalp lesions, laceration or hematoma. IMPRESSION: Normal head CT. Electronically Signed   By: Rudie Meyer M.D.   On: 02/14/2018 15:19    Procedures Procedures (including critical care time)  Medications Ordered in ED Medications - No data to display   Initial Impression / Assessment and Plan / ED Course  I have reviewed the triage vital signs and the nursing notes.  Pertinent labs & imaging results that were available during my care of the patient were reviewed by me and considered in my medical decision making (see chart for details).     68 y.o. male here with intermittent brief episodes of tingling/paresthesias to R mouth and arm, weakness/discoordination of R arm/leg, slurred speech, and dizziness. Also having mild SOB. On exam, clear lung exam, no pedal edema, no tachycardia or hypoxia, no focal neuro deficits. States they occur in brief episodes lasting a few minutes and resolving. Nothing focal on exam during  these episodes. Will get stroke like work up, but doubt need for calling code stroke at this time. Will reassess shortly. Discussed case with my attending Dr. Juleen China who agrees with plan.   4:09 PM CBC w/diff WNL. CMP unremarkable. Trop neg. INR WNL. APTT WNL. EtOH level undetectable. CXR without acute findings, shows changes of COPD and chronic bronchitis but  nothing acute. CT head negative. EKG without acute ischemic findings. Spoke with Dr. Otelia LimesLindzen who is here seeing another patient, he agrees that this could represent TIAs and feels it's prudent to admit for TIA work up. Will formally consult his colleague Dr. Wilford CornerArora as well, to get neurology formally on board with this patient. Will proceed with admission.   4:17 PM Dr. Catha GosselinMikhail and [facility] returning page and will admit/consult on patient. Holding orders to be placed by admitting team. Please see their notes for further documentation of care. I appreciate their help with this pleasant pt's care. Pt stable at time of admission.    Final Clinical Impressions(s) / ED Diagnoses   Final diagnoses:  Paresthesias  Right sided weakness  TIA (transient ischemic attack)  SOB (shortness of breath)    ED Discharge Orders    [address]None       Leeza Heiner, [place]cedes, [place]PA-C 02/14/18 1619    Raeford RazorKohut, Stephen, MD 02/15/18 [phone]

## 2018-02-14 NOTE — ED Triage Notes (Addendum)
Pt BIB [facility]. EMS reports that patient was having some shortness of breath this morning when he woke up around 0900. Pt reports that around 1100 he began to have intermittent numbness in his right arm and right side of his face. Pt states "I think I am having a stroke."

## 2018-02-14 NOTE — H&P (Signed)
[facility] History and Physical  Ethan Reed [id number]:[id number] DOB: 10-05-1950 DOA: 02/14/2018  PCP: Mila PalmerWolters, Sharon, MD  Patient coming from: home  Chief Complaint: Right sided weakness and numbness  HPI: Ethan EvansWilliam B Reed is a 68 y.o. male with a medical history of hypertension, who presented to the emergency department with complaints of right-sided weakness and numbness which started today on 11 AM.  He states he has been having intermittent episodes lasting approximately 25 minutes of right arm and leg weakness as well as discoordination with his right arm.  He has felt some numbness in his fingertips as well.  Patient also states he had some slurred speech this morning along with dizziness.  He denied any chest pain, shortness of breath, abdominal pain, nausea or vomiting, diarrhea or constipation, problems with dysuria, hematuria, headache, visual changes, neck pain, recent illness or travel.  He does admit to not taking his medications for blood pressure as directed.  ED Course: CT head unremarkable.  Neurology consulted, TR[facility] asked to admit  Review of Systems:  All other systems reviewed and are negative.   Past Medical History:  Diagnosis Date  . Hypertension     No past surgical history on file.  Social History:  reports that he has never smoked. He has never used smokeless tobacco. He reports that he does not drink alcohol or use drugs.   Allergies  Allergen Reactions  . Oxycodone Nausea And Vomiting and Other (See Comments)    Dizziness    No family history on file.  No family history of heart disease or diabetes  Prior to Admission medications   Medication Sig Start Date End Date Taking? Authorizing Provider  cyclobenzaprine (FLEXERIL) 10 MG tablet Take 1 tablet (10 mg total) by mouth 2 (two) times daily as needed for muscle spasms. 01/08/14   Arthor CaptainHarris, Abigail, PA-C  diclofenac (VOLTAREN) 75 MG EC tablet Take 1 tablet (75 mg total) by mouth 2 (two) times  daily. 03/29/17   Elson AreasSofia, Leslie K, PA-C  naproxen (NAPROSYN) 500 MG tablet Take 1 tablet (500 mg total) by mouth 2 (two) times daily with a meal. 01/08/14   Harris, Abigail, PA-C  traMADol (ULTRAM) 50 MG tablet Take 1 tablet (50 mg total) by mouth every 6 (six) hours as needed. 01/08/14   Arthor CaptainHarris, Abigail, PA-C    Physical Exam: Vitals:   02/14/18 1300 02/14/18 1500  BP: (!) 165/101   Pulse: 95 91  Resp:  12  Temp:    SpO2: 97% 96%     General: Well developed, well nourished, NAD, appears stated age  HEENT: NCAT, PERRLA, EOMI, Anicteic Sclera, mucous membranes moist.   Neck: Supple, no JVD, no masses  Cardiovascular: S1 S2 auscultated, no rubs, murmurs or gallops. Regular rate and rhythm.  Respiratory: Clear to auscultation bilaterally with equal chest rise  Abdomen: Soft, nontender, nondistended, + bowel sounds  Extremities: warm dry without cyanosis clubbing or edema  Neuro: AAOx3, cranial nerves grossly intact. Strength 5/5 in patient's upper and lower extremities bilaterally, right upper extremity dysmetria with finger-to-nose testing  Skin: Without rashes exudates or nodules  Psych: Normal affect and demeanor with intact judgement and insight  Labs on Admission: I have personally reviewed following labs and imaging studies CBC: Recent Labs  Lab 02/14/18 1443  WBC 5.3  NEUTROABS 3.6  HGB 16.3  HCT 49.0  MCV 91.4  PLT 184   Basic Metabolic Panel: Recent Labs  Lab 02/14/18 1443  NA 139  K 3.6  CL 106  CO2 23  GLUCOSE 103*  BUN 12  CREATININE 0.93  CALCIUM 9.2   GFR: Estimated Creatinine Clearance: 86.9 mL/min (by C-G formula based on SCr of 0.93 mg/dL). Liver Function Tests: Recent Labs  Lab 02/14/18 1443  AST 18  ALT 23  ALKPHOS 56  BILITOT 0.9  PROT 7.6  ALBUMIN 3.8   No results for input(s): LIPASE, AMYLASE in the last 168 hours. No results for input(s): AMMONIA in the last 168 hours. Coagulation Profile: Recent Labs  Lab 02/14/18 1443    INR 1.02   Cardiac Enzymes: No results for input(s): CKTOTAL, CKMB, CKMBINDEX, TROPONINI in the last 168 hours. BNP (last 3 results) No results for input(s): PROBNP in the last 8760 hours. HbA1C: No results for input(s): HGBA1C in the last 72 hours. CBG: No results for input(s): GLUCAP in the last 168 hours. Lipid Profile: No results for input(s): CHOL, HDL, LDLCALC, TRIG, CHOLHDL, LDLDIRECT in the last 72 hours. Thyroid Function Tests: No results for input(s): TSH, T4TOTAL, FREET4, T3FREE, THYROIDAB in the last 72 hours. Anemia Panel: No results for input(s): VITAMINB12, FOLATE, FERRITIN, TIBC, IRON, RETICCTPCT in the last 72 hours. Urine analysis:    Component Value Date/Time   COLORURINE YELLOW 02/14/2018 1551   APPEARANCEUR CLEAR 02/14/2018 1551   LABSPEC 1.012 02/14/2018 1551   PHURINE 8.0 02/14/2018 1551   GLUCOSEU NEGATIVE 02/14/2018 1551   HGBUR SMALL (A) 02/14/2018 1551   BILIRUBINUR NEGATIVE 02/14/2018 1551   KETONESUR NEGATIVE 02/14/2018 1551   PROTEINUR NEGATIVE 02/14/2018 1551   NITRITE NEGATIVE 02/14/2018 1551   LEUKOCYTESUR NEGATIVE 02/14/2018 1551   Sepsis Labs: @LABRCNTIP (procalcitonin:4,lacticidven:4) )No results found for this or any previous visit (from the past 240 hour(s)).   Radiological Exams on Admission: Dg Chest 2 View  Result Date: 02/14/2018 CLINICAL DATA:  Lip numbness and right hand and leg numbness with discoordination of the right hand today. EXAM: CHEST - 2 VIEW COMPARISON:  None. FINDINGS: Normal sized heart. Tortuous aorta. The lungs are hyperexpanded with mild peribronchial thickening. Thoracic spine degenerative changes. IMPRESSION: No acute abnormality. Mild changes of COPD and chronic bronchitis. Electronically Signed   By: Beckie SaltsSteven  Reid M.D.   On: 02/14/2018 15:43   Ct Head Wo Contrast  Result Date: 02/14/2018 CLINICAL DATA:  Shortness of breath and intermittent numbness and tingling in right arm. EXAM: CT HEAD WITHOUT CONTRAST  TECHNIQUE: Contiguous axial images were obtained from the base of the skull through the vertex without intravenous contrast. COMPARISON:  01/11/2012 FINDINGS: Brain: The ventricles are normal in size and configuration. No extra-axial fluid collections are identified. The gray-white differentiation is maintained. No CT findings for acute hemispheric infarction or intracranial hemorrhage. No mass lesions. The brainstem and cerebellum are normal. Vascular: No hyperdense vessels or obvious aneurysm. Skull: No acute skull fracture.  No bone lesion. Sinuses/Orbits: The paranasal sinuses and mastoid air cells are clear. The globes are intact. Other: No scalp lesions, laceration or hematoma. IMPRESSION: Normal head CT. Electronically Signed   By: Rudie MeyerP.  Gallerani M.D.   On: 02/14/2018 15:19    EKG: Independently reviewed.  Sinus rhythm, rate 90  Assessment/Plan Right sided extremity weakness and numbness, suspect TIA -Patient presented with right-sided arm numbness and weakness, some right mouth tingling as well as speech impairment -Patient does have history of hypertension which is been uncontrolled -Currently exhibits dysmetria with the right upper extremity -CT head normal -Chest x-ray and UA reviewed, unremarkable for infection -Will obtain MRI/MRA brain, EEG, echocardiogram, carotid Doppler, lipid  panel, hemoglobin A1c -Neurology consulted and appreciated -Will place on aspirin -Counseled patient on the need for compliance with medications as well as how to control risk factors for future stroke  Essential hypertension, uncontrolled -Patient admits to being noncompliant with medications, states that he is supposed to take this medication in the morning however forgets at times -Will hold lisinopril/HCTZ 10/12.5mg  -Allow for permissive hypertension given suspected TIA -Hydralazine IV ordered for systolic blood pressures over 790  DVT prophylaxis:SCDs  Code Status: Full  Family Communication:  Fiance at bedside. Admission, patients condition and plan of care including tests being ordered have been discussed with the patient and fiance who indicate understanding and agree with the plan and Code Status.  Disposition Plan: Home   Consults called: Neurology   Admission status: observation   Time spent: 65 minutes  Magaret Justo D.O. [facility]  Between 7am to 7pm - Please see pager noted on [email or web address]  After 7pm go to [email or web address] 02/14/2018, 4:48 PM

## 2018-02-15 ENCOUNTER — Observation Stay (HOSPITAL_BASED_OUTPATIENT_CLINIC_OR_DEPARTMENT_OTHER): Payer: No Typology Code available for payment source

## 2018-02-15 ENCOUNTER — Observation Stay (HOSPITAL_COMMUNITY): Payer: No Typology Code available for payment source

## 2018-02-15 ENCOUNTER — Encounter (HOSPITAL_COMMUNITY): Payer: Self-pay

## 2018-02-15 DIAGNOSIS — R531 Weakness: Secondary | ICD-10-CM | POA: Diagnosis not present

## 2018-02-15 DIAGNOSIS — I63332 Cerebral infarction due to thrombosis of left posterior cerebral artery: Secondary | ICD-10-CM | POA: Diagnosis not present

## 2018-02-15 DIAGNOSIS — I361 Nonrheumatic tricuspid (valve) insufficiency: Secondary | ICD-10-CM

## 2018-02-15 DIAGNOSIS — E785 Hyperlipidemia, unspecified: Secondary | ICD-10-CM

## 2018-02-15 DIAGNOSIS — I10 Essential (primary) hypertension: Secondary | ICD-10-CM | POA: Diagnosis not present

## 2018-02-15 LAB — HIV ANTIBODY (ROUTINE TESTING W REFLEX): HIV SCREEN 4TH GENERATION: NONREACTIVE

## 2018-02-15 LAB — LIPID PANEL
CHOL/HDL RATIO: 6.3 ratio
Cholesterol: 214 mg/dL — ABNORMAL HIGH (ref 0–200)
HDL: 34 mg/dL — ABNORMAL LOW (ref >40)
LDL Cholesterol: 150 mg/dL — ABNORMAL HIGH (ref 0–99)
Triglycerides: 148 mg/dL (ref <150)
VLDL: 30 mg/dL (ref 0–40)

## 2018-02-15 LAB — ECHOCARDIOGRAM COMPLETE
Height: 72 in
Weight: 3040 oz

## 2018-02-15 LAB — HEMOGLOBIN A1C
Hgb A1c MFr Bld: 5.8 % — ABNORMAL HIGH (ref 4.8–5.6)
Mean Plasma Glucose: 119.76 mg/dL

## 2018-02-15 MED ORDER — ASPIRIN 81 MG PO TBEC: 81.0000 mg | DELAYED_RELEASE_TABLET | Freq: Every day | ORAL | 0 refills | Status: DC

## 2018-02-15 MED ORDER — ASPIRIN EC 81 MG PO TBEC
81.0000 mg | DELAYED_RELEASE_TABLET | Freq: Every day | ORAL | Status: DC
  Administered 2018-02-15: 81 mg via ORAL
  Filled 2018-02-15: qty 1

## 2018-02-15 MED ORDER — ATORVASTATIN CALCIUM 40 MG PO TABS: 40.0000 mg | ORAL_TABLET | Freq: Every day | ORAL | 0 refills | Status: AC

## 2018-02-15 MED ORDER — ATORVASTATIN CALCIUM 40 MG PO TABS
40.0000 mg | ORAL_TABLET | Freq: Every day | ORAL | Status: DC
  Administered 2018-02-15: 40 mg via ORAL
  Filled 2018-02-15: qty 1

## 2018-02-15 MED ORDER — CLOPIDOGREL BISULFATE 75 MG PO TABS
75.0000 mg | ORAL_TABLET | Freq: Every day | ORAL | Status: DC
  Administered 2018-02-15: 75 mg via ORAL
  Filled 2018-02-15: qty 1

## 2018-02-15 MED ORDER — CLOPIDOGREL BISULFATE 75 MG PO TABS: 75.0000 mg | ORAL_TABLET | Freq: Every day | ORAL | 0 refills | Status: DC

## 2018-02-15 NOTE — Progress Notes (Signed)
EEG completed, results pending. 

## 2018-02-15 NOTE — Care Management (Signed)
OP PT OT referral placed to neuro rehab per consult. No other CM needs identified.

## 2018-02-15 NOTE — Evaluation (Signed)
Physical Therapy Evaluation Patient Details Name: Ethan EvansWilliam B Pennywell MRN: [id number] DOB: January 12, 1951 Today's Date: 02/15/2018   History of Present Illness  68 y.o. male admitted with RUE weakness and numbness suspected to be TIA. 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 PMH includes HTN.     Clinical Impression  Pt admitted with above diagnosis. Pt currently with functional limitations due to the deficits listed below (see PT Problem List). PTA, pt indpeenent at baseline. Today presents with mild discoordination   of RUE, balance deficits with dynamic tasks. R sided stagger several times during gait with ability to self correct. Would benefit from higher level OP neuro PT.  Pt will benefit from skilled PT to increase their independence and safety with mobility to allow discharge to the venue listed below.       Follow Up Recommendations Outpatient PT(Neuro)    Equipment Recommendations  None recommended by PT    Recommendations for Other Services       Precautions / Restrictions Precautions Precautions: Fall Restrictions Weight Bearing Restrictions: No      Mobility  Bed Mobility Overal bed mobility: Modified Independent                Transfers Overall transfer level: Modified independent Equipment used: None                Ambulation/Gait Ambulation/Gait assistance: Min guard;Min assist Gait Distance (Feet): 130 Feet Assistive device: None Gait Pattern/deviations: Step-through pattern;Staggering right Gait velocity: decreased   General Gait Details: at times, patient LOB to Right but able self correct.   Stairs            Wheelchair Mobility    Modified Rankin (Stroke Patients Only)       Balance Overall balance assessment: Needs assistance   Sitting balance-Leahy Scale: Good       Standing balance-Leahy Scale: Fair                               Pertinent Vitals/Pain Pain Assessment:  No/denies pain    Home Living Family/patient expects to be discharged to:: Private residence Living Arrangements: Alone Available Help at Discharge: Family Type of Home: House Home Access: Level entry     Home Layout: One level Home Equipment: None      Prior Function Level of Independence: Independent         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[employer], retired     Higher education careers adviserHand Dominance   Dominant Hand: Right    Extremity/Trunk Assessment   Upper Extremity Assessment Upper Extremity Assessment: Defer to OT evaluation    Lower Extremity Assessment Lower Extremity Assessment: Overall WFL for tasks assessed       Communication   Communication: No difficulties  Cognition Arousal/Alertness: Awake/alert Behavior During Therapy: WFL for tasks assessed/performed Overall Cognitive Status: Within Functional Limits for tasks assessed                                        General Comments      Exercises     Assessment/Plan    PT Assessment Patient needs continued PT services  PT Problem List Decreased strength       PT Treatment Interventions DME instruction;Gait training;Stair training;Functional mobility training;Therapeutic activities;Therapeutic exercise;Balance training    PT Goals (Current goals can be found in the Care Plan  section)  Acute Rehab PT Goals Patient Stated Goal: go home  PT Goal Formulation: With patient Time For Goal Achievement: 03/01/18 Potential to Achieve Goals: Good    Frequency Min 4X/week   Barriers to discharge        Co-evaluation               AM-PAC PT "6 Clicks" Mobility  Outcome Measure Help needed turning from your back to your side while in a flat bed without using bedrails?: None Help needed moving from lying on your back to sitting on the side of a flat bed without using bedrails?: None Help needed moving to and from a bed to a chair (including a wheelchair)?: None Help needed standing up from a chair  using your arms (e.g., wheelchair or bedside chair)?: None Help needed to walk in hospital room?: A Little Help needed climbing 3-5 steps with a railing? : A Little 6 Click Score: 22    End of Session Equipment Utilized During Treatment: Gait belt Activity Tolerance: Patient tolerated treatment well Patient left: in bed Nurse Communication: Mobility status PT Visit Diagnosis: Unsteadiness on feet (R26.81)    Time: [id number] PT Time Calculation (min) (ACUTE ONLY): 40 min   Charges:   PT Evaluation $PT Eval Low Complexity: 1 Low PT Treatments $Gait Training: 8-22 mins       Etta GrandchildSean Annaleia Pence, PT, DPT Acute Rehabilitation Services Pager: [phone] Office: [phone]    Etta GrandchildSean Dezra Mandella 02/15/2018, 10:31 AM

## 2018-02-15 NOTE — Discharge Summary (Signed)
Physician Discharge Summary  Ethan Reed YQM:[id number] DOB: July 16, 1950 DOA: 02/14/2018  PCP: Mila Palmer, MD  Admit date: 02/14/2018 Discharge date: 02/15/2018  Time spent: 45 minutes  Recommendations for Outpatient Follow-up:  Patient will be discharged to home with outpatient physical and occupational therapy.  Patient will need to follow up with primary care provider within one week of discharge.  Patient should continue medications as prescribed.  Patient should follow a heat healthy diet.   Discharge Diagnoses:  Principal problem: Acute CVA Essential hypertension Hyperlipidemia  Discharge Condition: Stable  Diet recommendation: heart healthy  Filed Weights   02/14/18 1256 02/15/18 0900  Weight: 86.2 kg 86.2 kg    History of present illness:  On 02/14/2018  Ethan Reed is a 68 y.o. male with a medical history of hypertension, who presented to the emergency department with complaints of right-sided weakness and numbness which started today on 11 AM.  He states he has been having intermittent episodes lasting approximately 25 minutes of right arm and leg weakness as well as discoordination with his right arm.  He has felt some numbness in his fingertips as well.  Patient also states he had some slurred speech this morning along with dizziness.  He denied any chest pain, shortness of breath, abdominal pain, nausea or vomiting, diarrhea or constipation, problems with dysuria, hematuria, headache, visual changes, neck pain, recent illness or travel.  He does admit to not taking his medications for blood pressure as directed.  Hospital Course:  Right sided extremity weakness and numbness, Acute CVA -Patient presented with right-sided arm numbness and weakness, some right mouth tingling as well as speech impairment -Patient does have history of hypertension which is been uncontrolled -Currently exhibits dysmetria with the right upper extremity -CT head normal -Chest  x-ray and UA reviewed, unremarkable for infection -MRI brain Acute/Subacute infarct in left ventrolateral thalamus measuring up to 14 mm.  No associated hemorrhage or mass-effect. -CTA head and neck showed patent carotid and vertebral arteries.  No dissection, aneurysm, hemodynamically significant stenosis.  Patent anterior and posterior intracranial circulation.  No large vessel occlusion, aneurysm, significant stenosis. -Hemoglobin A1c 5.8, LDL 150 -Echocardiogram showed an EF of 60 to 65% -EEG normal negative for seizure -Neurology consulted and appreciated, recommended aspirin 81 mg daily and clopidogrel 75 mg daily.  Continue DAPT for 3 weeks and aspirin alone.  Follow-up with neurology in 4 weeks. -PT/OT recommended outpatient physical therapy -Continue aspirin, statin, plavix -Counseled patient on the need for compliance with medications as well as how to control risk factors for future stroke  Essential hypertension, uncontrolled -Patient admits to being noncompliant with medications, states that he is supposed to take this medication in the morning however forgets at times -Will hold lisinopril/HCTZ 10/12.5mg  -Allow for permissive hypertension given suspected TIA -Hydralazine IV ordered for systolic blood pressures over 528  Hyperlipidemia -Lipid panel: Total cholesterol 214, HDL 34, LDL 150, triglycerides 148 -Placed on statin -monitor LFTs  Procedures: Echocardiogram EEG  Consultations: Neurology  Discharge Exam: Vitals:   02/15/18 1136 02/15/18 1514  BP: (!) 148/87 (!) 156/101  Pulse:  78  Resp:    Temp:  98.4 F (36.9 C)  SpO2:  98%     General: Well developed, well nourished, NAD, appears stated age  HEENT: NCAT,  mucous membranes moist.  Neck: Supple  Cardiovascular: S1 S2 auscultated, no rubs, murmurs or gallops. Regular rate and rhythm.  Respiratory: Clear to auscultation bilaterally with equal chest rise  Abdomen: Soft, nontender, nondistended, +  bowel sounds  Extremities: warm dry without cyanosis clubbing or edema  Neuro: AAOx3, cranial nerves grossly intact. Strength 5/5 in patient's upper and lower extremities bilaterally.  Ataxia with finger-to-nose testing on the right.  Skin: Without rashes exudates or nodules  Psych: Normal affect and demeanor with intact judgement and insight  Discharge Instructions Discharge Instructions    Ambulatory referral to Neurology   Complete by:  As directed    Follow up with stroke clinic NP (Jessica Vanschaick or Darrol Angel, if both not available, consider Manson Allan, or [facility]) at [facility] in about 4 weeks. Thanks.   Discharge instructions   Complete by:  As directed    Patient will be discharged to home with outpatient physical and occupational therapy.  Patient will need to follow up with primary care provider within one week of discharge.  Patient should continue medications as prescribed.  Patient should follow a heat healthy diet.     Allergies as of 02/15/2018      Reactions   Oxycodone Nausea And Vomiting, Other (See Comments)   Dizziness      Medication List    STOP taking these medications   diclofenac 75 MG EC tablet Commonly known as:  VOLTAREN     TAKE these medications   aspirin 81 MG EC tablet Take 1 tablet (81 mg total) by mouth daily. Start taking on:  February 16, 2018   atorvastatin 40 MG tablet Commonly known as:  LIPITOR Take 1 tablet (40 mg total) by mouth daily at 6 PM.   clopidogrel 75 MG tablet Commonly known as:  PLAVIX Take 1 tablet (75 mg total) by mouth daily. Start taking on:  February 16, 2018   lisinopril-hydrochlorothiazide 10-12.5 MG tablet Commonly known as:  PRINZIDE,ZESTORETIC Take 1 tablet by mouth daily.      Allergies  Allergen Reactions  . Oxycodone Nausea And Vomiting and Other (See Comments)    Dizziness   Follow-up Information    [facility]. Schedule an appointment as soon as possible for a visit in  4 week(s).   Specialty:  Neurology Contact information: [address] [place] [address] [phone]       Mila Palmer, MD. Schedule an appointment as soon as possible for a visit in 1 week(s).   Specialty:  Family Medicine Why:  Hospital follow up Contact information: [address] [place] [address] [phone]            The results of significant diagnostics from this hospitalization (including imaging, microbiology, ancillary and laboratory) are listed below for reference.    Significant Diagnostic Studies: Ct Angio Head W Or Wo Contrast  Result Date: 02/14/2018 CLINICAL DATA:  68 y/o M; right facial NR numbness with tingling intermittently. EXAM: CT ANGIOGRAPHY HEAD AND NECK TECHNIQUE: Multidetector CT imaging of the head and neck was performed using the standard protocol during bolus administration of intravenous contrast. Multiplanar CT image reconstructions and MIPs were obtained to evaluate the vascular anatomy. Carotid stenosis measurements (when applicable) are obtained utilizing NASCET criteria, using the distal internal carotid diameter as the denominator. CONTRAST:  ISOVUE-370 IOPAMIDOL (ISOVUE-370) INJECTION 76% COMPARISON:  02/14/2018 CT head. FINDINGS: CTA NECK FINDINGS Aortic arch: Standard branching. Imaged portion shows no evidence of aneurysm or dissection. No significant stenosis of the major arch vessel origins. Right carotid system: No evidence of dissection, stenosis (50% or greater) or occlusion. Left carotid system: No evidence of dissection, stenosis (50% or greater) or  occlusion. Mild non stenotic calcified plaque of the left carotid bifurcation. Vertebral arteries: Left dominant. No evidence of dissection, stenosis (50% or greater) or occlusion. Skeleton: Reversal of cervical curvature. C3-4 grade 1 anterolisthesis. Mild cervical spondylosis with predominant discogenic degenerative changes. No  high-grade bony spinal canal stenosis. Other neck: Negative. Upper chest: Negative. Review of the MIP images confirms the above findings CTA HEAD FINDINGS Anterior circulation: No significant stenosis, proximal occlusion, aneurysm, or vascular malformation. Posterior circulation: No significant stenosis, proximal occlusion, aneurysm, or vascular malformation. Venous sinuses: As permitted by contrast timing, patent. Anatomic variants: None significant. Delayed phase: No abnormal intracranial enhancement. Review of the MIP images confirms the above findings IMPRESSION: 1. Patent carotid and vertebral arteries. No dissection, aneurysm, or hemodynamically significant stenosis utilizing NASCET criteria. 2. Patent anterior and posterior intracranial circulation. No large vessel occlusion, aneurysm, or significant stenosis. These results were called by telephone at the time of interpretation on 02/14/2018 at 7:52 pm to Dr. Milon DikesASHISH ARORA, who verbally acknowledged these results. Electronically Signed   By: Mitzi HansenLance  Furusawa-Stratton M.D.   On: 02/14/2018 19:55   Dg Chest 2 View  Result Date: 02/14/2018 CLINICAL DATA:  Lip numbness and right hand and leg numbness with discoordination of the right hand today. EXAM: CHEST - 2 VIEW COMPARISON:  None. FINDINGS: Normal sized heart. Tortuous aorta. The lungs are hyperexpanded with mild peribronchial thickening. Thoracic spine degenerative changes. IMPRESSION: No acute abnormality. Mild changes of COPD and chronic bronchitis. Electronically Signed   By: Beckie SaltsSteven  Reid M.D.   On: 02/14/2018 15:43   Ct Head Wo Contrast  Result Date: 02/14/2018 CLINICAL DATA:  Shortness of breath and intermittent numbness and tingling in right arm. EXAM: CT HEAD WITHOUT CONTRAST TECHNIQUE: Contiguous axial images were obtained from the base of the skull through the vertex without intravenous contrast. COMPARISON:  01/11/2012 FINDINGS: Brain: The ventricles are normal in size and configuration. No  extra-axial fluid collections are identified. The gray-white differentiation is maintained. No CT findings for acute hemispheric infarction or intracranial hemorrhage. No mass lesions. The brainstem and cerebellum are normal. Vascular: No hyperdense vessels or obvious aneurysm. Skull: No acute skull fracture.  No bone lesion. Sinuses/Orbits: The paranasal sinuses and mastoid air cells are clear. The globes are intact. Other: No scalp lesions, laceration or hematoma. IMPRESSION: Normal head CT. Electronically Signed   By: Rudie MeyerP.  Gallerani M.D.   On: 02/14/2018 15:19   Ct Angio Neck W Or Wo Contrast  Result Date: 02/14/2018 CLINICAL DATA:  68 y/o M; right facial NR numbness with tingling intermittently. EXAM: CT ANGIOGRAPHY HEAD AND NECK TECHNIQUE: Multidetector CT imaging of the head and neck was performed using the standard protocol during bolus administration of intravenous contrast. Multiplanar CT image reconstructions and MIPs were obtained to evaluate the vascular anatomy. Carotid stenosis measurements (when applicable) are obtained utilizing NASCET criteria, using the distal internal carotid diameter as the denominator. CONTRAST:  100mL ISOVUE-370 IOPAMIDOL (ISOVUE-370) INJECTION 76% COMPARISON:  02/14/2018 CT head. FINDINGS: CTA NECK FINDINGS Aortic arch: Standard branching. Imaged portion shows no evidence of aneurysm or dissection. No significant stenosis of the major arch vessel origins. Right carotid system: No evidence of dissection, stenosis (50% or greater) or occlusion. Left carotid system: No evidence of dissection, stenosis (50% or greater) or occlusion. Mild non stenotic calcified plaque of the left carotid bifurcation. Vertebral arteries: Left dominant. No evidence of dissection, stenosis (50% or greater) or occlusion. Skeleton: Reversal of cervical curvature. C3-4 grade 1 anterolisthesis. Mild cervical spondylosis with predominant  discogenic degenerative changes. No high-grade bony spinal canal  stenosis. Other neck: Negative. Upper chest: Negative. Review of the MIP images confirms the above findings CTA HEAD FINDINGS Anterior circulation: No significant stenosis, proximal occlusion, aneurysm, or vascular malformation. Posterior circulation: No significant stenosis, proximal occlusion, aneurysm, or vascular malformation. Venous sinuses: As permitted by contrast timing, patent. Anatomic variants: None significant. Delayed phase: No abnormal intracranial enhancement. Review of the MIP images confirms the above findings IMPRESSION: 1. Patent carotid and vertebral arteries. No dissection, aneurysm, or hemodynamically significant stenosis utilizing NASCET criteria. 2. Patent anterior and posterior intracranial circulation. No large vessel occlusion, aneurysm, or significant stenosis. These results were called by telephone at the time of interpretation on 02/14/2018 at 7:52 pm to Dr. Milon Dikes, who verbally acknowledged these results. Electronically Signed   By: Mitzi Hansen M.D.   On: 02/14/2018 19:55   Mr Brain Wo Contrast  Result Date: 02/14/2018 CLINICAL DATA:  68 y/o M; intermittent numbness of right arm and right-sided face. EXAM: MRI HEAD WITHOUT CONTRAST MRA HEAD WITHOUT CONTRAST TECHNIQUE: Multiplanar, multiecho pulse sequences of the brain and surrounding structures were obtained without intravenous contrast. Angiographic images of the head were obtained using MRA technique without contrast. COMPARISON:  None. FINDINGS: MRI HEAD FINDINGS Brain: 10 x 9 x 14 mm focus of reduced diffusion centered within the left ventrolateral thalamus with mildly increased T2 FLAIR hyperintense signal compatible with acute/early subacute infarction (series 3, image 33 and series 9, image 15). No associated hemorrhage or mass effect. Scattered punctate nonspecific T2 FLAIR hyperintensities in subcortical and periventricular white matter are compatible with mild chronic microvascular ischemic changes.  Mild volume loss of the brain. No extra-axial collection, hydrocephalus, mass effect, or herniation. Vascular: As below. Skull and upper cervical spine: Normal marrow signal. Sinuses/Orbits: Negative. Other: None. MRA HEAD FINDINGS Internal carotid arteries:  Patent. Anterior cerebral arteries:  Patent. Middle cerebral arteries: Patent. Anterior communicating artery: Patent. Posterior communicating arteries:  Patent. Posterior cerebral arteries:  Patent. Basilar artery:  Patent. Vertebral arteries:  Patent. No evidence of high-grade stenosis, large vessel occlusion, or aneurysm. IMPRESSION: 1. 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 No associated hemorrhage or mass effect. 2. Mild chronic microvascular ischemic changes and volume loss of the brain. 3. Patent anterior and posterior intracranial circulation. No large vessel occlusion, aneurysm, or significant stenosis is identified. These results were called by telephone at the time of interpretation on 02/14/2018 at 7:57 pm to Dr. Laurence Slate , who verbally acknowledged these results. Electronically Signed   By: Mitzi Hansen M.D.   On: 02/14/2018 20:04   Mr Maxine Glenn Head Wo Contrast  Result Date: 02/14/2018 CLINICAL DATA:  69 y/o M; intermittent numbness of right arm and right-sided face. EXAM: MRI HEAD WITHOUT CONTRAST MRA HEAD WITHOUT CONTRAST TECHNIQUE: Multiplanar, multiecho pulse sequences of the brain and surrounding structures were obtained without intravenous contrast. Angiographic images of the head were obtained using MRA technique without contrast. COMPARISON:  None. FINDINGS: MRI HEAD FINDINGS Brain: 10 x 9 x 14 mm focus of reduced diffusion centered within the left ventrolateral thalamus with mildly increased T2 FLAIR hyperintense signal compatible with acute/early subacute infarction (series 3, image 33 and series 9, image 15). No associated hemorrhage or mass effect. Scattered punctate nonspecific T2 FLAIR  hyperintensities in subcortical and periventricular white matter are compatible with mild chronic microvascular ischemic changes. Mild volume loss of the brain. No extra-axial collection, hydrocephalus, mass effect, or herniation. Vascular: As below. Skull and  upper cervical spine: Normal marrow signal. Sinuses/Orbits: Negative. Other: None. MRA HEAD FINDINGS Internal carotid arteries:  Patent. Anterior cerebral arteries:  Patent. Middle cerebral arteries: Patent. Anterior communicating artery: Patent. Posterior communicating arteries:  Patent. Posterior cerebral arteries:  Patent. Basilar artery:  Patent. Vertebral arteries:  Patent. No evidence of high-grade stenosis, large vessel occlusion, or aneurysm. IMPRESSION: 1. 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 No associated hemorrhage or mass effect. 2. Mild chronic microvascular ischemic changes and volume loss of the brain. 3. Patent anterior and posterior intracranial circulation. No large vessel occlusion, aneurysm, or significant stenosis is identified. These results were called by telephone at the time of interpretation on 02/14/2018 at 7:57 pm to Dr. Laurence Slate , who verbally acknowledged these results. Electronically Signed   By: Mitzi Hansen M.D.   On: 02/14/2018 20:04    Microbiology: No results found for this or any previous visit (from the past 240 hour(s)).   Labs: Basic Metabolic Panel: Recent Labs  Lab 02/14/18 1443  NA 139  K 3.6  CL 106  CO2 23  GLUCOSE 103*  BUN 12  CREATININE 0.93  CALCIUM 9.2   Liver Function Tests: Recent Labs  Lab 02/14/18 1443  AST 18  ALT 23  ALKPHOS 56  BILITOT 0.9  PROT 7.6  ALBUMIN 3.8   No results for input(s): LIPASE, AMYLASE in the last 168 hours. No results for input(s): AMMONIA in the last 168 hours. CBC: Recent Labs  Lab 02/14/18 1443  WBC 5.3  NEUTROABS 3.6  HGB 16.3  HCT 49.0  MCV 91.4  PLT 184   Cardiac Enzymes: No  results for input(s): CKTOTAL, CKMB, CKMBINDEX, TROPONINI in the last 168 hours. BNP: BNP (last 3 results) No results for input(s): BNP in the last 8760 hours.  ProBNP (last 3 results) No results for input(s): PROBNP in the last 8760 hours.  CBG: No results for input(s): GLUCAP in the last 168 hours.     Signed:  Edsel Petrin  [facility] 02/15/2018, 3:17 PM

## 2018-02-15 NOTE — Progress Notes (Signed)
  Echocardiogram 2D Echocardiogram has been performed.  Ethan Reed F 02/15/2018, 9:05 AM

## 2018-02-15 NOTE — Procedures (Signed)
History: 68 year old male being evaluated for transient neurological symptoms  Sedation: None  Technique: This is a 21 channel routine scalp EEG performed at the bedside with bipolar and monopolar montages arranged in accordance to the international 10/20 system of electrode placement. One channel was dedicated to EKG recording.    Background: The background consists of intermixed alpha and beta activities. There is a well defined posterior dominant rhythm of 11 hz that attenuates with eye opening. Sleep is recorded with normal appearing structures.   Photic stimulation: Physiologic driving is not performed  EEG Abnormalities: None  Clinical Interpretation: This normal EEG is recorded in the waking and sleep state. There was no seizure or seizure predisposition recorded on this study. Please note that lack of epileptiform activity on EEG does not preclude the possibility of epilepsy.   Ritta SlotMcNeill Bexton Haak, MD [facility] [phone]  If 7pm- 7am, please page neurology on call as listed in AMION.

## 2018-02-15 NOTE — Evaluation (Signed)
Occupational Therapy Evaluation Patient Details Name: Ethan Reed MRN: [id number] DOB: 1950-04-14 Today's Date: 02/15/2018    History of Present Illness 68 y.o. male admitted with RUE weakness and numbness suspected to be TIA. 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 PMH includes HTN.    Clinical Impression   PTA patient independent and driving.  Admitted for above and presents with R UE dysmetria and weakness, impaired dynamic balance.  Patient requires supervision for LB ADLs and grooming standing at sink, supervision for transfers. Cognition appears WFL, although at times requires increased time to process and respond.  Patient will benefit from continued OT services while admitted and will best benefit from OP OT services in order to optimize independence and return to PLOF. Will follow.     Follow Up Recommendations  Outpatient OT;Supervision - Intermittent    Equipment Recommendations  None recommended by OT    Recommendations for Other Services       Precautions / Restrictions Precautions Precautions: Fall Restrictions Weight Bearing Restrictions: No      Mobility Bed Mobility Overal bed mobility: Modified Independent                Transfers Overall transfer level: Needs assistance Equipment used: None Transfers: Sit to/from Stand Sit to Stand: Supervision         General transfer comment: supervision for safety    Balance Overall balance assessment: Needs assistance   Sitting balance-Leahy Scale: Good     Standing balance support: No upper extremity supported;During functional activity Standing balance-Leahy Scale: Fair                             ADL either performed or assessed with clinical judgement   ADL Overall ADL's : Needs assistance/impaired     Grooming: Wash/dry hands;Oral care;Wash/dry face;Supervision/safety;Standing   Upper Body Bathing: Supervision/ safety;Sitting   Lower  Body Bathing: Supervison/ safety;Sit to/from stand   Upper Body Dressing : Supervision/safety;Sitting   Lower Body Dressing: Supervision/safety;Sit to/from stand Lower Body Dressing Details (indicate cue type and reason): increased time require to tie shoes  Toilet Transfer: Ambulation;Supervision/safety Toilet Transfer Details (indicate cue type and reason): simulated in room          Functional mobility during ADLs: Supervision/safety;Cueing for safety       Vision Baseline Vision/History: Wears glasses Wears Glasses: At all times Patient Visual Report: No change from baseline Vision Assessment?: No apparent visual deficits     Perception     Praxis      Pertinent Vitals/Pain Pain Assessment: No/denies pain     Hand Dominance Left   Extremity/Trunk Assessment Upper Extremity Assessment Upper Extremity Assessment: RUE deficits/detail RUE Deficits / Details: grossly 4/5 MMT, decreased coordination and dysmetric but functional  RUE Sensation: WNL RUE Coordination: decreased gross motor;decreased fine motor   Lower Extremity Assessment Lower Extremity Assessment: Defer to PT evaluation       Communication Communication Communication: No difficulties   Cognition Arousal/Alertness: Awake/alert Behavior During Therapy: WFL for tasks assessed/performed Overall Cognitive Status: Within Functional Limits for tasks assessed                                 General Comments: slow to process and repsond, but anticipate baseline; fiance present and reports normal cogntion    General Comments  fiancee present and supportive    Exercises  Shoulder Instructions      Home Living Family/patient expects to be discharged to:: Private residence Living Arrangements: Alone Available Help at Discharge: Family Type of Home: House Home Access: Level entry     Home Layout: One level     Bathroom Shower/Tub: Producer, television/film/video: Standard      Home Equipment: Shower seat - built in          Prior Functioning/Environment Level of Independence: Independent        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baseline, retired        OT Problem List: Decreased strength;Impaired balance (sitting and/or standing);Decreased coordination;Impaired UE functional use      OT Treatment/Interventions: Self-care/ADL training;Therapeutic exercise;Patient/family education;Therapeutic activities;DME and/or AE instruction;Cognitive remediation/compensation;Balance training;Neuromuscular education    OT Goals(Current goals can be found in the care plan section) Acute Rehab OT Goals Patient Stated Goal: go home  OT Goal Formulation: With patient Time For Goal Achievement: 03/01/18 Potential to Achieve Goals: Good  OT Frequency: Min 2X/week   Barriers to D/C:            Co-evaluation              AM-PAC OT "6 Clicks" Daily Activity     Outcome Measure Help from another person eating meals?: None Help from another person taking care of personal grooming?: None Help from another person toileting, which includes using toliet, bedpan, or urinal?: None Help from another person bathing (including washing, rinsing, drying)?: None Help from another person to put on and taking off regular upper body clothing?: None Help from another person to put on and taking off regular lower body clothing?: None 6 Click Score: 24   End of Session Nurse Communication: Mobility status  Activity Tolerance: Patient tolerated treatment well Patient left: in bed;with call bell/phone within reach;with bed alarm set;with family/visitor present  OT Visit Diagnosis: Other abnormalities of gait and mobility (R26.89);Muscle weakness (generalized) (M62.81);Other symptoms and signs involving the nervous system (R29.898)                Time: 0940-1002 OT Time Calculation (min): 22 min Charges:  OT General Charges $OT Visit: 1 Visit OT Evaluation $OT Eval Moderate  Complexity: 1 Mod  Chancy Milroy, OT Acute Rehabilitation Services Pager [phone] Office [phone]   Chancy Milroy 02/15/2018, 10:48 AM

## 2018-02-15 NOTE — Discharge Instructions (Signed)

## 2018-02-15 NOTE — Progress Notes (Addendum)
STROKE TEAM PROGRESS NOTE   SUBJECTIVE (INTERVAL HISTORY) His wife is at the bedside.  Patient still complains mild right facial and right lip as well as right fingertip numbness.  Right leg still feels mild heaviness.  PT/OT recommend outpatient PT/OT.  MRI showed left thalamic infarct.  OBJECTIVE Vitals:   02/15/18 0228 02/15/18 0406 02/15/18 0428 02/15/18 0743  BP: 114/87 117/80 118/86 129/87  Pulse: 79 65 70 71  Resp: (!) 22 20 12 12   Temp: 98.7 F (37.1 C) 98.5 F (36.9 C) 98.2 F (36.8 C) 98.4 F (36.9 C)  TempSrc: Oral Oral Oral Oral  SpO2: 95% 95% 94% 96%  Weight:      Height:        CBC:  Recent Labs  Lab 02/14/18 1443  WBC 5.3  NEUTROABS 3.6  HGB 16.3  HCT 49.0  MCV 91.4  PLT 184    Basic Metabolic Panel:  Recent Labs  Lab 02/14/18 1443  NA 139  K 3.6  CL 106  CO2 23  GLUCOSE 103*  BUN 12  CREATININE 0.93  CALCIUM 9.2    Lipid Panel:     Component Value Date/Time   CHOL 214 (H) 02/15/2018 0411   TRIG 148 02/15/2018 0411   HDL 34 (L) 02/15/2018 0411   CHOLHDL 6.3 02/15/2018 0411   VLDL 30 02/15/2018 0411   LDLCALC 150 (H) 02/15/2018 0411   HgbA1c:  Lab Results  Component Value Date   HGBA1C 5.8 (H) 02/15/2018   Urine Drug Screen:     Component Value Date/Time   LABOPIA NONE DETECTED 02/14/2018 1551   COCAINSCRNUR NONE DETECTED 02/14/2018 1551   LABBENZ NONE DETECTED 02/14/2018 1551   AMPHETMU NONE DETECTED 02/14/2018 1551   THCU NONE DETECTED 02/14/2018 1551   LABBARB NONE DETECTED 02/14/2018 1551    Alcohol Level     Component Value Date/Time   ETH <10 02/14/2018 1443    IMAGING   Ct Angio Head W Or Wo Contrast  Result Date: 02/14/2018 CLINICAL DATA:  68 y/o M; right facial NR numbness with tingling intermittently. EXAM: CT ANGIOGRAPHY HEAD AND NECK TECHNIQUE: Multidetector CT imaging of the head and neck was performed using the standard protocol during bolus administration of intravenous contrast. Multiplanar CT image  reconstructions and MIPs were obtained to evaluate the vascular anatomy. Carotid stenosis measurements (when applicable) are obtained utilizing NASCET criteria, using the distal internal carotid diameter as the denominator. CONTRAST:  ISOVUE-370 IOPAMIDOL (ISOVUE-370) INJECTION 76% COMPARISON:  02/14/2018 CT head. FINDINGS: CTA NECK FINDINGS Aortic arch: Standard branching. Imaged portion shows no evidence of aneurysm or dissection. No significant stenosis of the major arch vessel origins. Right carotid system: No evidence of dissection, stenosis (50% or greater) or occlusion. Left carotid system: No evidence of dissection, stenosis (50% or greater) or occlusion. Mild non stenotic calcified plaque of the left carotid bifurcation. Vertebral arteries: Left dominant. No evidence of dissection, stenosis (50% or greater) or occlusion. Skeleton: Reversal of cervical curvature. C3-4 grade 1 anterolisthesis. Mild cervical spondylosis with predominant discogenic degenerative changes. No high-grade bony spinal canal stenosis. Other neck: Negative. Upper chest: Negative. Review of the MIP images confirms the above findings CTA HEAD FINDINGS Anterior circulation: No significant stenosis, proximal occlusion, aneurysm, or vascular malformation. Posterior circulation: No significant stenosis, proximal occlusion, aneurysm, or vascular malformation. Venous sinuses: As permitted by contrast timing, patent. Anatomic variants: None significant. Delayed phase: No abnormal intracranial enhancement. Review of the MIP images confirms the above findings IMPRESSION: 1. Patent  carotid and vertebral arteries. No dissection, aneurysm, or hemodynamically significant stenosis utilizing NASCET criteria. 2. Patent anterior and posterior intracranial circulation. No large vessel occlusion, aneurysm, or significant stenosis. These results were called by telephone at the time of interpretation on 02/14/2018 at 7:52 pm to Dr. Milon Dikes, who  verbally acknowledged these results. Electronically Signed   By: Mitzi Hansen M.D.   On: 02/14/2018 19:55   Dg Chest 2 View  Result Date: 02/14/2018 CLINICAL DATA:  Lip numbness and right hand and leg numbness with discoordination of the right hand today. EXAM: CHEST - 2 VIEW COMPARISON:  None. FINDINGS: Normal sized heart. Tortuous aorta. The lungs are hyperexpanded with mild peribronchial thickening. Thoracic spine degenerative changes. IMPRESSION: No acute abnormality. Mild changes of COPD and chronic bronchitis. Electronically Signed   By: Beckie Salts M.D.   On: 02/14/2018 15:43   Ct Head Wo Contrast  Result Date: 02/14/2018 CLINICAL DATA:  Shortness of breath and intermittent numbness and tingling in right arm. EXAM: CT HEAD WITHOUT CONTRAST TECHNIQUE: Contiguous axial images were obtained from the base of the skull through the vertex without intravenous contrast. COMPARISON:  01/11/2012 FINDINGS: Brain: The ventricles are normal in size and configuration. No extra-axial fluid collections are identified. The gray-white differentiation is maintained. No CT findings for acute hemispheric infarction or intracranial hemorrhage. No mass lesions. The brainstem and cerebellum are normal. Vascular: No hyperdense vessels or obvious aneurysm. Skull: No acute skull fracture.  No bone lesion. Sinuses/Orbits: The paranasal sinuses and mastoid air cells are clear. The globes are intact. Other: No scalp lesions, laceration or hematoma. IMPRESSION: Normal head CT. Electronically Signed   By: Rudie Meyer M.D.   On: 02/14/2018 15:19   Ct Angio Neck W Or Wo Contrast  Result Date: 02/14/2018 CLINICAL DATA:  68 y/o M; right facial NR numbness with tingling intermittently. EXAM: CT ANGIOGRAPHY HEAD AND NECK TECHNIQUE: Multidetector CT imaging of the head and neck was performed using the standard protocol during bolus administration of intravenous contrast. Multiplanar CT image reconstructions and MIPs were  obtained to evaluate the vascular anatomy. Carotid stenosis measurements (when applicable) are obtained utilizing NASCET criteria, using the distal internal carotid diameter as the denominator. CONTRAST:  ISOVUE-370 IOPAMIDOL (ISOVUE-370) INJECTION 76% COMPARISON:  02/14/2018 CT head. FINDINGS: CTA NECK FINDINGS Aortic arch: Standard branching. Imaged portion shows no evidence of aneurysm or dissection. No significant stenosis of the major arch vessel origins. Right carotid system: No evidence of dissection, stenosis (50% or greater) or occlusion. Left carotid system: No evidence of dissection, stenosis (50% or greater) or occlusion. Mild non stenotic calcified plaque of the left carotid bifurcation. Vertebral arteries: Left dominant. No evidence of dissection, stenosis (50% or greater) or occlusion. Skeleton: Reversal of cervical curvature. C3-4 grade 1 anterolisthesis. Mild cervical spondylosis with predominant discogenic degenerative changes. No high-grade bony spinal canal stenosis. Other neck: Negative. Upper chest: Negative. Review of the MIP images confirms the above findings CTA HEAD FINDINGS Anterior circulation: No significant stenosis, proximal occlusion, aneurysm, or vascular malformation. Posterior circulation: No significant stenosis, proximal occlusion, aneurysm, or vascular malformation. Venous sinuses: As permitted by contrast timing, patent. Anatomic variants: None significant. Delayed phase: No abnormal intracranial enhancement. Review of the MIP images confirms the above findings IMPRESSION: 1. Patent carotid and vertebral arteries. No dissection, aneurysm, or hemodynamically significant stenosis utilizing NASCET criteria. 2. Patent anterior and posterior intracranial circulation. No large vessel occlusion, aneurysm, or significant stenosis. These results were called by telephone at the time of  interpretation on 02/14/2018 at 7:52 pm to Dr. Milon DikesASHISH ARORA, who verbally acknowledged these  results. Electronically Signed   By: Mitzi HansenLance  Furusawa-Stratton M.D.   On: 02/14/2018 19:55   Mr Brain Wo Contrast  Result Date: 02/14/2018 CLINICAL DATA:  68 y/o M; intermittent numbness of right arm and right-sided face. EXAM: MRI HEAD WITHOUT CONTRAST MRA HEAD WITHOUT CONTRAST TECHNIQUE: Multiplanar, multiecho pulse sequences of the brain and surrounding structures were obtained without intravenous contrast. Angiographic images of the head were obtained using MRA technique without contrast. COMPARISON:  None. FINDINGS: MRI HEAD FINDINGS Brain: 10 x 9 x 14 mm focus of reduced diffusion centered within the left ventrolateral thalamus with mildly increased T2 FLAIR hyperintense signal compatible with acute/early subacute infarction (series 3, image 33 and series 9, image 15). No associated hemorrhage or mass effect. Scattered punctate nonspecific T2 FLAIR hyperintensities in subcortical and periventricular white matter are compatible with mild chronic microvascular ischemic changes. Mild volume loss of the brain. No extra-axial collection, hydrocephalus, mass effect, or herniation. Vascular: As below. Skull and upper cervical spine: Normal marrow signal. Sinuses/Orbits: Negative. Other: None. MRA HEAD FINDINGS Internal carotid arteries:  Patent. Anterior cerebral arteries:  Patent. Middle cerebral arteries: Patent. Anterior communicating artery: Patent. Posterior communicating arteries:  Patent. Posterior cerebral arteries:  Patent. Basilar artery:  Patent. Vertebral arteries:  Patent. No evidence of high-grade stenosis, large vessel occlusion, or aneurysm. IMPRESSION: 1. 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 No associated hemorrhage or mass effect. 2. Mild chronic microvascular ischemic changes and volume loss of the brain. 3. Patent anterior and posterior intracranial circulation. No large vessel occlusion, aneurysm, or significant stenosis is identified. These  results were called by telephone at the time of interpretation on 02/14/2018 at 7:57 pm to Dr. Laurence SlateAroor , who verbally acknowledged these results. Electronically Signed   By: Mitzi HansenLance  Furusawa-Stratton M.D.   On: 02/14/2018 20:04   Mr Maxine GlennMra Head Wo Contrast  Result Date: 02/14/2018 CLINICAL DATA:  68 y/o M; intermittent numbness of right arm and right-sided face. EXAM: MRI HEAD WITHOUT CONTRAST MRA HEAD WITHOUT CONTRAST TECHNIQUE: Multiplanar, multiecho pulse sequences of the brain and surrounding structures were obtained without intravenous contrast. Angiographic images of the head were obtained using MRA technique without contrast. COMPARISON:  None. FINDINGS: MRI HEAD FINDINGS Brain: 10 x 9 x 14 mm focus of reduced diffusion centered within the left ventrolateral thalamus with mildly increased T2 FLAIR hyperintense signal compatible with acute/early subacute infarction (series 3, image 33 and series 9, image 15). No associated hemorrhage or mass effect. Scattered punctate nonspecific T2 FLAIR hyperintensities in subcortical and periventricular white matter are compatible with mild chronic microvascular ischemic changes. Mild volume loss of the brain. No extra-axial collection, hydrocephalus, mass effect, or herniation. Vascular: As below. Skull and upper cervical spine: Normal marrow signal. Sinuses/Orbits: Negative. Other: None. MRA HEAD FINDINGS Internal carotid arteries:  Patent. Anterior cerebral arteries:  Patent. Middle cerebral arteries: Patent. Anterior communicating artery: Patent. Posterior communicating arteries:  Patent. Posterior cerebral arteries:  Patent. Basilar artery:  Patent. Vertebral arteries:  Patent. No evidence of high-grade stenosis, large vessel occlusion, or aneurysm. IMPRESSION: 1. 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 No associated hemorrhage or mass effect. 2. Mild chronic microvascular ischemic changes and volume loss of the brain. 3. Patent  anterior and posterior intracranial circulation. No large vessel occlusion, aneurysm, or significant stenosis is identified. These results were called by telephone at the time of  interpretation on 02/14/2018 at 7:57 pm to Dr. Laurence SlateAroor , who verbally acknowledged these results. Electronically Signed   By: Mitzi HansenLance  Furusawa-Stratton M.D.   On: 02/14/2018 20:04    PHYSICAL EXAM  Temp:  [98.2 F (36.8 C)-98.8 F (37.1 C)] 98.4 F (36.9 C) (01/25 0743) Pulse Rate:  [65-91] 71 (01/25 0743) Resp:  [11-22] 12 (01/25 0743) BP: (114-170)/(80-109) 148/87 (01/25 1136) SpO2:  [94 %-98 %] 96 % (01/25 0743) Weight:  [86.2 kg] 86.2 kg (01/25 0900)  General - Well nourished, well developed, in no apparent distress.  Ophthalmologic - fundi not visualized due to noncooperation.  Cardiovascular - Regular rate and rhythm.  Mental Status -  Level of arousal and orientation to time, place, and person were intact. Language including expression, naming, repetition, comprehension was assessed and found intact. Fund of Knowledge was assessed and was intact.  Cranial Nerves II - XII - II - Visual field intact OU. III, IV, VI - Extraocular movements intact. V - Facial sensation intact bilaterally. VII - Facial movement intact bilaterally. VIII - Hearing & vestibular intact bilaterally. X - Palate elevates symmetrically. XI - Chin turning & shoulder shrug intact bilaterally XII - Tongue protrusion intact.  Motor Strength - The patient's strength was normal in all extremities and pronator drift was absent.  Bulk was normal and fasciculations were absent.   Motor Tone - Muscle tone was assessed at the neck and appendages and was normal.  Reflexes - The patient's reflexes were symmetrical in all extremities and he had no pathological reflexes.  Sensory - Light touch, temperature/pinprick were assessed and were symmetrical.    Coordination - The patient had normal movements in the hands and feet with no ataxia or  dysmetria.  Tremor was absent.  Gait and Station - deferred.   ASSESSMENT/PLAN Mr. Ethan EvansWilliam B Reed is a 68 y.o. male with history of hypertension and hyperlipidemia presenting with right-sided arm and leg numbness, wax and wane with dizziness/lightheadedness. He did not receive IV t-PA due to mild symptoms.   Stroke: Left thalamic infarct, likely small vessel disease  CT head no acute abnormality  MRI head left thalamic infarct  MRA head unremarkable  CTA H&N -unremarkable  2D Echo -EF 60 to 65%  EEG normal, negative for seizure  LDL -150  HgbA1c -5.8  UDS negative  VTE prophylaxis -SCDs  No antithrombotic prior to admission, now on aspirin 81 mg daily and clopidogrel 75 mg daily.  Recommend to continue DAPT for 3 weeks and then aspirin alone  Patient counseled to be compliant with his antithrombotic medications  Ongoing aggressive stroke risk factor management  Therapy recommendations: Patient PT/OT  Disposition:  Pending  Hypertension  Stable .  Permissive hypertension (OK if < 220/120) but gradually normalize in 2-3 days .  Long-term BP goal normotensive  Hyperlipidemia  Lipid lowering medication PTA: None  LDL 150, goal < 70  Current lipid lowering medication: Lipitor 40  Continue statin at discharge  Other Stroke Risk Factors  Advanced age  Other Active Problems    Hospital day # 0  Neurology will sign off. Please call with questions. [facility]. Thanks for the consult.  Marvel PlanJindong Graylin Sperling, MD PhD Stroke Neurology 02/15/2018 1:59 PM   To contact Stroke Continuity provider, please refer to [email or web address]. After hours, contact General Neurology

## 2018-02-19 ENCOUNTER — Ambulatory Visit: Payer: No Typology Code available for payment source | Attending: Internal Medicine | Admitting: Occupational Therapy

## 2018-02-19 ENCOUNTER — Encounter: Payer: Self-pay | Admitting: Rehabilitative and Restorative Service Providers"

## 2018-02-19 ENCOUNTER — Ambulatory Visit: Payer: No Typology Code available for payment source | Admitting: Rehabilitative and Restorative Service Providers"

## 2018-02-19 ENCOUNTER — Encounter: Payer: Self-pay | Admitting: Occupational Therapy

## 2018-02-19 ENCOUNTER — Other Ambulatory Visit: Payer: Self-pay

## 2018-02-19 ENCOUNTER — Ambulatory Visit: Payer: No Typology Code available for payment source | Admitting: Physical Therapy

## 2018-02-19 DIAGNOSIS — R2689 Other abnormalities of gait and mobility: Secondary | ICD-10-CM

## 2018-02-19 DIAGNOSIS — I69315 Cognitive social or emotional deficit following cerebral infarction: Secondary | ICD-10-CM | POA: Insufficient documentation

## 2018-02-19 DIAGNOSIS — R482 Apraxia: Secondary | ICD-10-CM | POA: Diagnosis present

## 2018-02-19 DIAGNOSIS — R293 Abnormal posture: Secondary | ICD-10-CM | POA: Insufficient documentation

## 2018-02-19 DIAGNOSIS — I69353 Hemiplegia and hemiparesis following cerebral infarction affecting right non-dominant side: Secondary | ICD-10-CM | POA: Insufficient documentation

## 2018-02-19 DIAGNOSIS — M25511 Pain in right shoulder: Secondary | ICD-10-CM | POA: Diagnosis present

## 2018-02-19 DIAGNOSIS — R29818 Other symptoms and signs involving the nervous system: Secondary | ICD-10-CM | POA: Diagnosis present

## 2018-02-19 DIAGNOSIS — R270 Ataxia, unspecified: Secondary | ICD-10-CM | POA: Insufficient documentation

## 2018-02-19 NOTE — Therapy (Signed)
[facility] [address] [place], [place], [address] Phone: [phone]   Fax:  [phone]  Physical Therapy Evaluation  Patient Details  Name: Ethan Reed MRN: [id number] Date of Birth: August 15, 1950 Referring Provider (PT): Mila Palmer, MD (was referred by hospitalist, however will f/u with primary care MD).   Encounter Date: 02/19/2018  PT End of Session - 02/19/18 1058    Visit Number  1    Number of Visits  4    Date for PT Re-Evaluation  03/21/18    Authorization Type  [employer] 15% coinsurance    PT Start Time  1024    PT Stop Time  1100    PT Time Calculation (min)  36 min    Activity Tolerance  Patient tolerated treatment well    Behavior During Therapy  WFL for tasks assessed/performed       Past Medical History:  Diagnosis Date  . CVA (cerebral vascular accident) (HCC)   . Dysrhythmia   . Hypertension   . Medical history non-contributory     History reviewed. No pertinent surgical history.  There were no vitals filed for this visit.   Subjective Assessment - 02/19/18 1023    Subjective  The patient is s/p L thalamic stroke on 02/14/2018 with R side mild weakness and decreased control.  He notes cc: R shoulder pain, numbness around lips, first 2 finger numbness, R leg catching at times.      Pertinent History  HTN, yperlipidemia, acute stroke.    Patient Stated Goals  Major goal is to get back to 100% and get control of my arm, reduce tingling in hand and mouth.  Also get leg back "I've already started improving in my leg."  He also notes balance has improved.      Currently in Pain?  Yes   hurt himself lifting his bike out of the [employer].    Pain Score  4     Pain Location  Back    Pain Orientation  Right    Pain Descriptors / Indicators  Tightness    Pain Type  Acute pain    Pain Onset  1 to 4 weeks ago    Pain Frequency  Intermittent    Aggravating Factors   No worse since stroke    Pain  Relieving Factors  nothing         [facility] PT Assessment - 02/19/18 1028      Assessment   Medical Diagnosis  L thalamic CVA    Referring Provider (PT)  Mila Palmer, MD (was referred by hospitalist, however will f/u with primary care MD).    Onset Date/Surgical Date  02/14/18    Hand Dominance  Left    Prior Therapy  acute PT only      Precautions   Precaution Comments  No falls.      Restrictions   Weight Bearing Restrictions  No      Balance Screen   Has the patient fallen in the past 6 months  No    Has the patient had a decrease in activity level because of a fear of falling?   No    Is the patient reluctant to leave their home because of a fear of falling?   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  He does get some mild back pain with LE MMT.       Flexibility   Soft Tissue Assessment /Muscle Length  yes    Hamstrings  tightness bilateral hamstrings      Ambulation/Gait   Ambulation/Gait  Yes    Ambulation/Gait  Assistance  7: Independent    Ambulation Distance (Feet)  200 Feet    Assistive device  None    Gait Pattern  Decreased arm swing - left;Decreased arm swing - right    Ambulation Surface  Level;Indoor    Gait velocity  3.08 ft/sec    Stairs  Yes    Stairs Assistance  7: Independent    Stair Management Technique  No rails;Alternating pattern      Standardized Balance Assessment   Standardized Balance Assessment  Berg Balance Test;Timed Up and Go Test      Berg Balance Test   Sit to Stand  Able to stand without using hands and stabilize independently    Standing Unsupported  Able to stand safely 2 minutes    Sitting with Back Unsupported but Feet Supported on Floor or Stool  Able to sit safely and securely 2 minutes    Stand to Sit  Sits safely with minimal use of hands    Transfers  Able to transfer safely, minor use of hands    Standing Unsupported with Eyes Closed  Able to stand 10 seconds safely    Standing Ubsupported with Feet Together  Able to place feet together independently and stand 1 minute safely    From Standing, Reach Forward with Outstretched Arm  Can reach confidently >25 cm (10")    From Standing Position, Pick up Object from Floor  Able to pick up shoe safely and easily    From Standing Position, Turn to Look Behind Over each Shoulder  Looks behind from both sides and weight shifts well    Turn 360 Degrees  Able to turn 360 degrees safely but slowly    Standing Unsupported, Alternately Place Feet on Step/Stool  Able to stand independently and safely and complete 8 steps in 20 seconds    Standing Unsupported, One Foot in Front  Able to place foot tandem independently and hold 30 seconds    Standing on One Leg  Able to lift leg independently and hold > 10 seconds  Total Score  54    Berg comment:  54/56      Timed Up and Go Test   TUG  --   8.37 seconds without a device, no loss of balance     Functional Gait  Assessment   Gait assessed   Yes    Gait Level  Surface  Walks 20 ft in less than 7 sec but greater than 5.5 sec, uses assistive device, slower speed, mild gait deviations, or deviates 6-10 in outside of the 12 in walkway width.    Change in Gait Speed  Able to change speed, demonstrates mild gait deviations, deviates 6-10 in outside of the 12 in walkway width, or no gait deviations, unable to achieve a major change in velocity, or uses a change in velocity, or uses an assistive device.    Gait with Horizontal Head Turns  Performs head turns smoothly with no change in gait. Deviates no more than 6 in outside 12 in walkway width    Gait with Vertical Head Turns  Performs head turns with no change in gait. Deviates no more than 6 in outside 12 in walkway width.    Gait and Pivot Turn  Pivot turns safely within 3 sec and stops quickly with no loss of balance.    Step Over Obstacle  Is able to step over 2 stacked shoe boxes taped together (9 in total height) without changing gait speed. No evidence of imbalance.    Gait with Narrow Base of Support  Ambulates 4-7 steps.    Gait with Eyes Closed  Walks 20 ft, no assistive devices, good speed, no evidence of imbalance, normal gait pattern, deviates no more than 6 in outside 12 in walkway width. Ambulates 20 ft in less than 7 sec.    Ambulating Backwards  Walks 20 ft, uses assistive device, slower speed, mild gait deviations, deviates 6-10 in outside 12 in walkway width.    Steps  Alternating feet, no rail.    Total Score  25    FGA comment:  25/30                Objective measurements completed on examination: See above findings.              PT Education - 02/19/18 2042    Education Details  HEP for toe walking, tandem gait and hamstring stretch    Person(s) Educated  Patient    Methods  Explanation;Demonstration;Handout    Comprehension  Verbalized understanding;Returned demonstration          PT Long Term Goals - 02/19/18 2042      PT LONG TERM GOAL #1   Title  The  patient will be indep with HEP for high level balance and R motor control.    Time  4    Period  Weeks    Target Date  03/21/18      PT LONG TERM GOAL #2   Title  The patient will improve FGA from 25/30 to > or equal to 28/30.    Time  4    Period  Weeks    Target Date  03/21/18      PT LONG TERM GOAL #3   Title  The patient will verbalize understanding of community exercis eoptions.    Time  4    Period  Weeks    Target Date  03/21/18             Plan - 02/19/18 2043  Clinical Impression Statement  The patient is a 68 year old male presenting to OP phsyical therapy s/p CVA 02/14/18.  The patient notes improvement in LE function since last week.  He demonstrates mild impairments in R ankle control during toe walking, decreased balance during tandem/narrow base of support walking, and mild decrease in speed of gait.  PT provided HEP to address deficits and will f/u in 2-3 weeks to progress exercises further and check on progress.      Clinical Presentation  Stable    Clinical Decision Making  Low    Rehab Potential  Good    PT Frequency  1x / week    PT Duration  4 weeks    PT Treatment/Interventions  ADLs/Self Care Home Management;Therapeutic activities;Therapeutic exercise;Balance training;Gait training;Stair training;Functional mobility training;Patient/family education;Neuromuscular re-education    PT Next Visit Plan  check HEP, re-assess FGA, check LTGs, d/c with community/home program as able.    Consulted and Agree with Plan of Care  Patient       Patient will benefit from skilled therapeutic intervention in order to improve the following deficits and impairments:  Abnormal gait, Decreased coordination, Decreased balance  Visit Diagnosis: Other abnormalities of gait and mobility     Problem List Patient Active Problem List   Diagnosis Date Noted  . TIA (transient ischemic attack) 02/14/2018  . Essential hypertension 02/14/2018    Alesia Oshields,  PT 02/19/2018, 8:47 PM  [facility] [address] [place], [place], [address] Phone: [phone]   Fax:  [phone]  Name: BARNEY GESSERT MRN: [id number] Date of Birth: Sep 01, 1950

## 2018-02-19 NOTE — Patient Instructions (Signed)
Access Code: [email or web address]  URL: [email or web address]  Date: 02/19/2018  Prepared by: Margretta Ditty   Exercises Tandem Walking with Counter Support - 8 reps - 5 sets - 1x daily - 7x weekly Toe Walking - 10 reps - 5 sets - 1x daily - 7x weekly Seated Hamstring Stretch with Chair - 3 reps - 1 sets - 30 seconds. hold - 1x daily - 7x weekly

## 2018-02-19 NOTE — Therapy (Signed)
[facility] [address] [place], [place], [address] Phone: [phone]   Fax:  [phone]  Occupational Therapy Evaluation  Patient Details  Name: Ethan Reed MRN: [id number] Date of Birth: May 10, 1950 Referring Provider (OT): Shelly Rubenstein (Pt's PCP)   Encounter Date: 02/19/2018  OT End of Session - 02/19/18 2127    Visit Number  1    Number of Visits  16    Date for OT Re-Evaluation  04/16/18    Authorization Type  [employer] (federal employee)  60 VL combined PT/OT/ST    OT Start Time  [phone]   pt arrived late   OT Stop Time  0930    OT Time Calculation (min)  36 min    Activity Tolerance  Patient tolerated treatment well       Past Medical History:  Diagnosis Date  . CVA (cerebral vascular accident) (HCC)   . Dysrhythmia   . Hypertension   . Medical history non-contributory     History reviewed. No pertinent surgical history.  There were no vitals filed for this visit.  Subjective Assessment - 02/19/18 0857    Pertinent History  02/14/2018 L thalamic stroke d/c home on 02/15/2018.      Patient Stated Goals  better use of my arm, I have tingling in my fingers, get better control of my R leg its kind of lazy    Currently in Pain?  Yes    Pain Score  4     Pain Location  Back    Pain Orientation  Right    Pain Descriptors / Indicators  Tightness    Pain Type  Acute pain   I picked something up wrong it started before the stroke   Pain Onset  1 to 4 weeks ago    Aggravating Factors   nothing I just picked something up wrong it isn't any worse since the stroke    Pain Relieving Factors  nothing        [facility] OT Assessment - 02/19/18 0001      Assessment   Medical Diagnosis  L thalamic CVA    Referring Provider (OT)  Shelly Rubenstein   Pt's PCP   Onset Date/Surgical Date  02/14/18    Hand Dominance  Left    Prior Therapy  acute PT only      Precautions   Precautions  --    PT eval today/denies  any LOB/falls; monintor for fall risk     Restrictions   Weight Bearing Restrictions  No      Balance Screen   Has the patient fallen in the past 6 months  No      Home  Environment   Family/patient expects to be discharged to:  Private residence    Living Arrangements  Alone    Type of Home  House    Home Layout  One level    Bathroom Shower/Tub  Walk-in Shower    Bathroom Toilet  Standard    Additional Comments  Pt has no equipment in bathroom.  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      ADL   Eating/Feeding  Independent    Grooming  Independent  Upper Body Bathing  Modified independent   pt reports pain in R shoulder when bathing LUE   Lower Body Bathing  Modified independent    Upper Body Dressing  Independent    Lower Body Dressing  Independent    Toilet Transfer  Independent    Toileting - Clothing Manipulation  Independent    Toileting -  Geneticist, molecular  Independent      IADL   Shopping  Takes care of all shopping needs independently    Light Housekeeping  Maintains house alone or with occasional assistance    Meal Prep  Plans, prepares and serves adequate meals independently    Education officer, environmental own vehicle    Medication Management  Is responsible for taking medication in correct dosages at correct time    Physicist, medical financial matters independently (budgets, writes checks, pays rent, bills goes to bank), collects and keeps track of income      Mobility   Mobility Status  Independent      Written Expression   Dominant Hand  Left      Vision - History   Baseline Vision  Bifocals    Additional Comments  Pt denies any visual changes      Vision Assessment   Eye Alignment  Within Functional Limits       Activity Tolerance   Activity Tolerance  Tolerate 30+ min activity without fatigue   pt needs to rest after 30 minutes of activity     Cognition   Overall Cognitive Status  Impaired/Different from baseline    Mini Mental State Exam   Pt reports he needs more time now to formulate his thoughts.  PT reports slower processing. Able to attend well in busy clinic and presents as good historian. Will monitor      Posture/Postural Control   Posture/Postural Control  Postural limitations    Posture Comments  R scapula in adduction, depression and downward rotation.       Sensation   Light Touch  Impaired by gross assessment   R hand slightly impaired   Hot/Cold  Appears Intact    Proprioception  Appears Intact      Coordination   Gross Motor Movements are Fluid and Coordinated  Yes   pain with abduction 7/10   9 Hole Peg Test  Right;Left    Right 9 Hole Peg Test  44.50    Left 9 Hole Peg Test  27.40   dominant unaffected hand     Praxis   Praxis  --   to be further assessed"I have to concentrate more to use it"     Tone   Assessment Location  Right Upper Extremity      ROM / Strength   AROM / PROM / Strength  AROM;Strength      AROM   Overall AROM   Within functional limits for tasks performed    Overall AROM Comments  BUE   Pain with abduction 7/10     Strength   Overall Strength  Deficits    Overall Strength Comments  Shoulder flexion 4+/5, pain with MMT in abduction      Hand Function   Right Hand Gross Grasp  Functional    Right Hand Grip (lbs)  40    Left Hand Gross Grasp  Functional    Left Hand Grip (lbs)  45   dominant hand     RUE Tone   RUE  Tone  Mild;Modified Ashworth   habituates rapidly     RUE Tone   Modified Ashworth Scale for Grading Hypertonia RUE  Slight increase in muscle tone, manifested by a catch and release or by minimal resistance at the end of the range of motion when the affected part(s) is moved in flexion or extension                         OT Short Term Goals - 02/19/18 2112      OT SHORT TERM GOAL #1   Title  Pt will be mod I with HEP for R hand coordination, RUE proximal strength - 03/19/2018    Status  New      OT SHORT TERM GOAL #2   Title  Pt will demonstrate improved coordination RUE as evidenced by decreasing time on 9 hole peg test by at least 7 seconds to assist with fine motor tasks.    Baseline  baseline= 44.80    Status  New      OT SHORT TERM GOAL #3   Title  Complete MOCA and establish cognitive goal if appropriate    Status  New      OT SHORT TERM GOAL #4   Title  Pt will report pain no greater than 4/10 in R shoulder when using RUE to bath L shoulder    Baseline  baseline= 7/10    Status  New        OT Long Term Goals - 02/19/18 2117      OT LONG TERM GOAL #1   Title  Pt will be mod I with home activity program for RUE functional use and cognitive tasks prn - 04/16/2018    Status  New      OT LONG TERM GOAL #2   Title  Pt will demonstrate improved coordination RUE as evidenced by decreasing time on 9 hole peg test by at least 14 seconds to assist with fine motor tasks.     Baseline  baseline= 44.80    Status  New      OT LONG TERM GOAL #3   Title  Pt will report pain no greater than 3/10 in R shoulder with horizontal reach into high kitchen cabinet during cooking tasks    Baseline  baseline = 7/10    Status  New      OT LONG TERM GOAL #4   Title  Pt will demonstrate ability to obtain glass mixing bowl from overhead cabinet with RUE without dropping.     Status  New      OT LONG TERM GOAL #5   Title  Pt will return to use of computer using both hands PRN for [employer] business    Status  New            Plan - 02/19/18 2121    Clinical Impression Statement  Pt is a 68 year old male s/p L thalalmic CVA on 02/14/2018.  Pt was discharged home alone on 02/15/2018.  Pt presents today with the following impairments that impact functional use of RUE and  maximal independence:  R non dominant hemiplegia, impaired sensation, decreased coordination RUE (ataxia), pain R shoulder, apraxia, abnormal posture and slowed cogntive processing.  Pt will benefit from skilled OT to address these deficits to maximize independence and improve functional use of RUE.      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  PMH:  HTN    Occupational performance deficits (Please refer to evaluation for details):  ADL's;IADL's;Work;Leisure;Social Participation    Rehab Potential  Good    OT Frequency  2x / week    OT Duration  8 weeks    OT Treatment/Interventions  Self-care/ADL training;Aquatic Therapy;Therapeutic exercise;Neuromuscular education;DME and/or AE instruction;Manual Therapy;Passive range of motion;Energy conservation;Therapeutic activities;Patient/family education;Cognitive remediation/compensation    Plan  complete MOCA and set goal prn, ?speech referral??. initiate HEP for coordination    Clinical Decision Making  Several treatment options, min-mod task modification necessary    Consulted and Agree with Plan of Care  Patient       Patient will benefit from skilled therapeutic intervention in order to improve the following deficits and impairments:  Decreased activity tolerance, Decreased coordination, Decreased cognition, Decreased balance, Decreased strength, Impaired UE functional use, Impaired sensation, Impaired tone, Pain  Visit Diagnosis: Hemiplegia and hemiparesis following cerebral infarction affecting right non-dominant side (HCC) - Plan: Ot plan of care cert/re-cert  Ataxia - Plan: Ot plan of care cert/re-cert  Acute pain of right shoulder - Plan: Ot plan of care cert/re-cert  Abnormal posture - Plan: Ot plan of care cert/re-cert  Apraxia - Plan: Ot plan of care cert/re-cert  Other symptoms and signs involving the nervous system - Plan: Ot plan of care  cert/re-cert  Cognitive social or emotional deficit following cerebral infarction - Plan: Ot plan of care cert/re-cert    Problem List Patient Active Problem List   Diagnosis Date Noted  . TIA (transient ischemic attack) 02/14/2018  . Essential hypertension 02/14/2018    Norton Pastel, OTR/L 02/19/2018, 9:35 PM  [facility] [address] [place], [place], [address] Phone: [phone]   Fax:  [phone]  Name: Ethan Reed MRN: [id number] Date of Birth: 1950/11/02

## 2018-02-24 ENCOUNTER — Ambulatory Visit: Payer: No Typology Code available for payment source | Attending: Internal Medicine | Admitting: Occupational Therapy

## 2018-02-24 ENCOUNTER — Encounter: Payer: Self-pay | Admitting: Occupational Therapy

## 2018-02-24 DIAGNOSIS — I69315 Cognitive social or emotional deficit following cerebral infarction: Secondary | ICD-10-CM | POA: Insufficient documentation

## 2018-02-24 DIAGNOSIS — M25511 Pain in right shoulder: Secondary | ICD-10-CM | POA: Insufficient documentation

## 2018-02-24 DIAGNOSIS — R293 Abnormal posture: Secondary | ICD-10-CM | POA: Diagnosis present

## 2018-02-24 DIAGNOSIS — R482 Apraxia: Secondary | ICD-10-CM | POA: Insufficient documentation

## 2018-02-24 DIAGNOSIS — R2689 Other abnormalities of gait and mobility: Secondary | ICD-10-CM | POA: Diagnosis present

## 2018-02-24 DIAGNOSIS — R29818 Other symptoms and signs involving the nervous system: Secondary | ICD-10-CM | POA: Diagnosis present

## 2018-02-24 DIAGNOSIS — I69353 Hemiplegia and hemiparesis following cerebral infarction affecting right non-dominant side: Secondary | ICD-10-CM | POA: Insufficient documentation

## 2018-02-24 DIAGNOSIS — R270 Ataxia, unspecified: Secondary | ICD-10-CM | POA: Diagnosis present

## 2018-02-24 NOTE — Therapy (Signed)
[facility] [address] [place], [place], [address] Phone: [phone]   Fax:  [phone]  Occupational Therapy Treatment  Patient Details  Name: Ethan Reed MRN: [id number] Date of Birth: September 21, 1950 Referring Provider (OT): Shelly Rubenstein (Pt's PCP)   Encounter Date: 02/24/2018  OT End of Session - 02/24/18 1203    Visit Number  2    Number of Visits  16   pt likely will not need all visits   Date for OT Re-Evaluation  04/16/18    Authorization Type  [employer] (federal employee)  60 VL combined PT/OT/ST    OT Start Time  1102    OT Stop Time  1148    OT Time Calculation (min)  46 min    Activity Tolerance  Patient tolerated treatment well       Past Medical History:  Diagnosis Date  . CVA (cerebral vascular accident) (HCC)   . Dysrhythmia   . Hypertension   . Medical history non-contributory     History reviewed. No pertinent surgical history.  There were no vitals filed for this visit.  Subjective Assessment - 02/24/18 1106    Subjective   My back pain has pretty much gone away - I only have some shoulder pain with certain movements    Pertinent History  02/14/2018 L thalamic stroke d/c home on 02/15/2018.      Patient Stated Goals  better use of my arm, I have tingling in my fingers, get better control of my R leg its kind of lazy    Currently in Pain?  No/denies   no shoulder pain currently will document thru session if it arises                  OT Treatments/Exercises (OP) - 02/24/18 0001      ADLs   ADL Comments  Reviewed OT goals, POC and eval processs.  Pt in agreeement. Pt provided with goals in writing.  Also addressed asssessment of cogntive skills using MOCA - pt scored 24/30 (normal is at least 26).  Pt demonstrated difficulties with alternating and divided attention, working memory, mental manipulation, and minor fluency in language.  Discussed results with pt and recommended  ST eval - pt agreeable.  Contacted primary MD to obtain order and will assist pt in scheduling once order is received. Pt also displaying slow rate of processing, difficulty following multi step instructions. Pt demonstrates impairment in anticipatory awareness  as well.        Neurological Re-education Exercises   Other Exercises 1  Neuro re ed to address development of HEP for R hand coordination.  After instruction, demonstration and practice pt able to return demonstrate all activities. HEP given in writing to pt.               OT Education - 02/24/18 1159    Education Details  HEP for coordination, results of MOCA assessment and recommendation for ST eval    Person(s) Educated  Patient    Methods  Explanation;Demonstration    Comprehension  Verbalized understanding;Returned demonstration       OT Short Term Goals - 02/24/18 1201      OT SHORT TERM GOAL #1   Title  Pt will be mod I with HEP for R hand coordination, RUE proximal strength - 03/19/2018    Status  On-going      OT SHORT TERM GOAL #2   Title  Pt will demonstrate improved  coordination RUE as evidenced by decreasing time on 9 hole peg test by at least 7 seconds to assist with fine motor tasks.    Baseline  baseline= 44.80    Status  On-going      OT SHORT TERM GOAL #3   Title  Complete MOCA and establish cognitive goal if appropriate    Status  On-going      OT SHORT TERM GOAL #4   Title  Pt will report pain no greater than 4/10 in R shoulder when using RUE to bath L shoulder    Baseline  baseline= 7/10    Status  On-going        OT Long Term Goals - 02/24/18 1201      OT LONG TERM GOAL #1   Title  Pt will be mod I with home activity program for RUE functional use and cognitive tasks prn - 04/16/2018    Status  On-going      OT LONG TERM GOAL #2   Title  Pt will demonstrate improved coordination RUE as evidenced by decreasing time on 9 hole peg test by at least 14 seconds to assist with fine motor tasks.      Baseline  baseline= 44.80    Status  On-going      OT LONG TERM GOAL #3   Title  Pt will report pain no greater than 3/10 in R shoulder with horizontal reach into high kitchen cabinet during cooking tasks    Baseline  baseline = 7/10    Status  On-going      OT LONG TERM GOAL #4   Title  Pt will demonstrate ability to obtain glass mixing bowl from overhead cabinet with RUE without dropping.     Status  On-going      OT LONG TERM GOAL #5   Title  Pt will return to use of computer using both hands PRN for [employer] business    Status  On-going            Plan - 02/24/18 1201    Clinical Impression Statement  Reviewed OT POC and goals and pt in agreement.  Pt progressing toward goals.     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  PMH:  HTN    Occupational performance deficits (Please refer to evaluation for details):  ADL's;IADL's;Work;Leisure;Social Participation    Rehab Potential  Good    OT Frequency  2x / week    OT Duration  8 weeks    OT Treatment/Interventions  Self-care/ADL training;Aquatic Therapy;Therapeutic exercise;Neuromuscular education;DME and/or AE instruction;Manual Therapy;Passive range of motion;Energy conservation;Therapeutic activities;Patient/family education;Cognitive remediation/compensation    Plan  check HEP for coordination, manual and NMR for R scapula alignment/trunk and RUE horizontal reach, address shoulder pain prn,     Consulted and Agree with Plan of Care  Patient       Patient will benefit from skilled therapeutic intervention in order to improve the following deficits and impairments:  Decreased activity tolerance, Decreased coordination, Decreased cognition, Decreased balance, Decreased strength, Impaired UE functional use, Impaired sensation, Impaired tone, Pain  Visit Diagnosis: Hemiplegia and hemiparesis following cerebral infarction affecting right  non-dominant side (HCC)  Ataxia  Acute pain of right shoulder  Abnormal posture  Apraxia  Other symptoms and signs involving the nervous system  Cognitive social or emotional deficit following cerebral infarction    Problem List Patient Active Problem List   Diagnosis Date Noted  .  TIA (transient ischemic attack) 02/14/2018  . Essential hypertension 02/14/2018    Norton PastelPulaski, Abisola Carrero Halliday, OTR/L 02/24/2018, 12:07 PM  [facility] [address] [place], [place], [address] Phone: [phone]   Fax:  [phone]  Name: Ethan Reed MRN: [id number] Date of Birth: [birth year]

## 2018-02-24 NOTE — Patient Instructions (Signed)
  Coordination Activities  Perform the following activities for 15-20 minutes 1-2 times per day with right hand(s).   Rotate ball in fingertips (clockwise and counter-clockwise).  Toss ball between hands.  Toss ball in air and catch with the same hand.  Flip cards 1 at a time as fast as you can. - Use a timer and try and beat your own time.  Deal cards with your thumb (Hold deck in hand and push card off top with thumb).  Shuffle cards.  Pick up coins and place in container or coin bank. Use a timer and see if you can beat your time.   Pick up coins and make stacks of five..  Pick up coins one at a time until you get 5-10 in your hand, then move coins from palm to fingertips to put into bank one a time. The goal is to do this without dropping any coins.  Screw together nuts and bolts, then unfasten. Use smaller bolts not big ones

## 2018-02-26 ENCOUNTER — Ambulatory Visit: Payer: No Typology Code available for payment source | Admitting: Occupational Therapy

## 2018-02-26 DIAGNOSIS — M25511 Pain in right shoulder: Secondary | ICD-10-CM

## 2018-02-26 DIAGNOSIS — R293 Abnormal posture: Secondary | ICD-10-CM

## 2018-02-26 DIAGNOSIS — R29818 Other symptoms and signs involving the nervous system: Secondary | ICD-10-CM

## 2018-02-26 DIAGNOSIS — I69353 Hemiplegia and hemiparesis following cerebral infarction affecting right non-dominant side: Secondary | ICD-10-CM | POA: Diagnosis not present

## 2018-02-26 DIAGNOSIS — R270 Ataxia, unspecified: Secondary | ICD-10-CM

## 2018-02-26 NOTE — Patient Instructions (Signed)
Laying on your back, hold an empty shoebox between your hands, push arms up towards the ceiling while holding shoe box, then bend elbows to bring shoe box down to your chest. Make sure to keep your shoulder blades down against the bed,  perform  10 reps 2x day  While laying on your back, straighten elbows, hold shoebox between hands, raise arms up to just above shoulder height, keep your shoulder blades down against bed, do not hike shoulders, focus on controlling right arm as you raise and lower your arm 10 reps 2x day

## 2018-02-26 NOTE — Therapy (Signed)
[facility] [address] [place], [place], [address] Phone: [phone]   Fax:  [phone]  Occupational Therapy Treatment  Patient Details  Name: Ethan Reed MRN: [id number] Date of Birth: 10/05/1950 Referring Provider (OT): Shelly Rubenstein (Pt's PCP)   Encounter Date: 02/26/2018  OT End of Session - 02/26/18 1543    Visit Number  3    Number of Visits  16    Date for OT Re-Evaluation  04/16/18    Authorization Type  [employer] (federal employee)  60 VL combined PT/OT/ST    OT Start Time  1533    OT Stop Time  1615    OT Time Calculation (min)  42 min       Past Medical History:  Diagnosis Date  . CVA (cerebral vascular accident) (HCC)   . Dysrhythmia   . Hypertension   . Medical history non-contributory     No past surgical history on file.  There were no vitals filed for this visit.  Subjective Assessment - 02/26/18 1534    Subjective   shoulder pain with certain movements    Pertinent History  02/14/2018 L thalamic stroke d/c home on 02/15/2018.      Patient Stated Goals  better use of my arm, I have tingling in my fingers, get better control of my R leg its kind of lazy    Currently in Pain?  Yes    Pain Score  5     Pain Location  Shoulder    Pain Orientation  Right    Pain Descriptors / Indicators  Tightness    Pain Type  Acute pain    Pain Onset  1 to 4 weeks ago    Pain Frequency  Intermittent    Aggravating Factors   malpositioning    Pain Relieving Factors  repositioning              Treatment: supine scapular retraction, followed by closed chain chest press and shoulder flexion, x 10 reps each, min v.c for shoulder postioning Butterfly stretch x 10 reps, then closed chain shoulder flexion with 2 lbs weight in supine followed by arm circles unilaterally with RUE, min facilitation Prone on elbows lifting chest, pt demonstrates right scapular winging/ weakness. Reviewed coordination  HEP, min v.c/ demonstration.            OT Education - 02/26/18 1601    Education Details  reviewed coordination HEP, supine shoulder flexion and chest press-closed chain.    Person(s) Educated  Patient    Methods  Explanation;Demonstration    Comprehension  Verbalized understanding;Returned demonstration;Verbal cues required       OT Short Term Goals - 02/24/18 1201      OT SHORT TERM GOAL #1   Title  Pt will be mod I with HEP for R hand coordination, RUE proximal strength - 03/19/2018    Status  On-going      OT SHORT TERM GOAL #2   Title  Pt will demonstrate improved coordination RUE as evidenced by decreasing time on 9 hole peg test by at least 7 seconds to assist with fine motor tasks.    Baseline  baseline= 44.80    Status  On-going      OT SHORT TERM GOAL #3   Title  Complete MOCA and establish cognitive goal if appropriate    Status  On-going      OT SHORT TERM GOAL #4   Title  Pt will report  pain no greater than 4/10 in R shoulder when using RUE to bath L shoulder    Baseline  baseline= 7/10    Status  On-going        OT Long Term Goals - 02/24/18 1201      OT LONG TERM GOAL #1   Title  Pt will be mod I with home activity program for RUE functional use and cognitive tasks prn - 04/16/2018    Status  On-going      OT LONG TERM GOAL #2   Title  Pt will demonstrate improved coordination RUE as evidenced by decreasing time on 9 hole peg test by at least 14 seconds to assist with fine motor tasks.     Baseline  baseline= 44.80    Status  On-going      OT LONG TERM GOAL #3   Title  Pt will report pain no greater than 3/10 in R shoulder with horizontal reach into high kitchen cabinet during cooking tasks    Baseline  baseline = 7/10    Status  On-going      OT LONG TERM GOAL #4   Title  Pt will demonstrate ability to obtain glass mixing bowl from overhead cabinet with RUE without dropping.     Status  On-going      OT LONG TERM GOAL #5   Title  Pt will  return to use of computer using both hands PRN for [employer] business    Status  On-going            Plan - 02/26/18 1543    Clinical Impression Statement  Pt is progressing towards goals for RUE functional use.    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  PMH:  HTN    Occupational performance deficits (Please refer to evaluation for details):  ADL's;IADL's;Work;Leisure;Social Participation    Rehab Potential  Good    OT Frequency  2x / week    OT Duration  8 weeks    OT Treatment/Interventions  Self-care/ADL training;Aquatic Therapy;Therapeutic exercise;Neuromuscular education;DME and/or AE instruction;Manual Therapy;Passive range of motion;Energy conservation;Therapeutic activities;Patient/family education;Cognitive remediation/compensation    Plan  , manual and NMR for R scapula alignment/trunk and RUE horizontal reach, address shoulder pain prn,     Consulted and Agree with Plan of Care  Patient       Patient will benefit from skilled therapeutic intervention in order to improve the following deficits and impairments:     Visit Diagnosis: Hemiplegia and hemiparesis following cerebral infarction affecting right non-dominant side (HCC)  Ataxia  Acute pain of right shoulder  Abnormal posture  Other symptoms and signs involving the nervous system    Problem List Patient Active Problem List   Diagnosis Date Noted  . TIA (transient ischemic attack) 02/14/2018  . Essential hypertension 02/14/2018    RINE,KATHRYN 02/26/2018, 4:46 PM  [facility] [address] [place], [place], [address] Phone: [phone]   Fax:  [phone]  Name: Ethan Reed MRN: [id number] Date of Birth: 1950/08/04

## 2018-03-04 ENCOUNTER — Ambulatory Visit: Payer: No Typology Code available for payment source | Admitting: Occupational Therapy

## 2018-03-04 DIAGNOSIS — M25511 Pain in right shoulder: Secondary | ICD-10-CM

## 2018-03-04 DIAGNOSIS — I69353 Hemiplegia and hemiparesis following cerebral infarction affecting right non-dominant side: Secondary | ICD-10-CM

## 2018-03-04 DIAGNOSIS — I69315 Cognitive social or emotional deficit following cerebral infarction: Secondary | ICD-10-CM

## 2018-03-04 DIAGNOSIS — R29818 Other symptoms and signs involving the nervous system: Secondary | ICD-10-CM

## 2018-03-04 DIAGNOSIS — R293 Abnormal posture: Secondary | ICD-10-CM

## 2018-03-04 NOTE — Therapy (Signed)
[facility] [address] [place], [place], [address] Phone: [phone]   Fax:  [phone]  Occupational Therapy Treatment  Patient Details  Name: Ethan Reed MRN: [id number] Date of Birth: 01/10/1951 Referring Provider (OT): Shelly Rubenstein (Pt's PCP)   Encounter Date: 03/04/2018  OT End of Session - 03/04/18 1610    Visit Number  4    Number of Visits  16    Date for OT Re-Evaluation  04/16/18    Authorization Type  [employer] (federal employee)  60 VL combined PT/OT/ST    OT Start Time  1535    OT Stop Time  1615    OT Time Calculation (min)  40 min       Past Medical History:  Diagnosis Date  . CVA (cerebral vascular accident) (HCC)   . Dysrhythmia   . Hypertension   . Medical history non-contributory     No past surgical history on file.  There were no vitals filed for this visit.  Subjective Assessment - 03/04/18 1608    Subjective   Pt reports using his RUE in garage and aggravating it    Pertinent History  02/14/2018 L thalamic stroke d/c home on 02/15/2018.      Patient Stated Goals  better use of my arm, I have tingling in my fingers, get better control of my R leg its kind of lazy    Currently in Pain?  Yes    Pain Score  3     Pain Location  Shoulder    Pain Orientation  Right    Pain Descriptors / Indicators  Tightness    Pain Type  Acute pain    Pain Onset  1 to 4 weeks ago    Pain Frequency  Intermittent    Aggravating Factors   malpositioning    Pain Relieving Factors  repositioning                      Treatment: supine scapular retraction, followed by closed chain chest press and shoulder flexion, x 10 reps each, min v.c for shoulder postioning Attempted unilateral shoulder flexion with 1 lbs weight, however pt reports discomfort therefore reverted to unilateral shoulder flexion AA/ROM without weight. Prone on elbows lifting chest, followed by quadraped lifting  alternate UE, for core stability and UE strength, min/ v.c / facilitation. Wall slides with therapist facilitating right scapula/ shoulder. Picking up and stacking coins then minipulating to place in container, min v.c. Copying small peg design, mod v.c for correct design, min difficulty for coordination and in hand manipulation.    OT Short Term Goals - 02/24/18 1201      OT SHORT TERM GOAL #1   Title  Pt will be mod I with HEP for R hand coordination, RUE proximal strength - 03/19/2018    Status  On-going      OT SHORT TERM GOAL #2   Title  Pt will demonstrate improved coordination RUE as evidenced by decreasing time on 9 hole peg test by at least 7 seconds to assist with fine motor tasks.    Baseline  baseline= 44.80    Status  On-going      OT SHORT TERM GOAL #3   Title  Complete MOCA and establish cognitive goal if appropriate    Status  On-going      OT SHORT TERM GOAL #4   Title  Pt will report pain no greater than 4/10 in R  shoulder when using RUE to bath L shoulder    Baseline  baseline= 7/10    Status  On-going        OT Long Term Goals - 02/24/18 1201      OT LONG TERM GOAL #1   Title  Pt will be mod I with home activity program for RUE functional use and cognitive tasks prn - 04/16/2018    Status  On-going      OT LONG TERM GOAL #2   Title  Pt will demonstrate improved coordination RUE as evidenced by decreasing time on 9 hole peg test by at least 14 seconds to assist with fine motor tasks.     Baseline  baseline= 44.80    Status  On-going      OT LONG TERM GOAL #3   Title  Pt will report pain no greater than 3/10 in R shoulder with horizontal reach into high kitchen cabinet during cooking tasks    Baseline  baseline = 7/10    Status  On-going      OT LONG TERM GOAL #4   Title  Pt will demonstrate ability to obtain glass mixing bowl from overhead cabinet with RUE without dropping.     Status  On-going      OT LONG TERM GOAL #5   Title  Pt will return to use  of computer using both hands PRN for [employer] business    Status  On-going            Plan - 03/04/18 1611    Clinical Impression Statement  Pt is progressing towards goals. He continues to experience right shoulder pain  with malpositioning. Pt demonstrates slowed processing with activities with a cognitive component.    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  PMH:  HTN    Occupational performance deficits (Please refer to evaluation for details):  ADL's;IADL's;Work;Leisure;Social Participation    Rehab Potential  Good    OT Frequency  2x / week    OT Duration  8 weeks    OT Treatment/Interventions  Self-care/ADL training;Aquatic Therapy;Therapeutic exercise;Neuromuscular education;DME and/or AE instruction;Manual Therapy;Passive range of motion;Energy conservation;Therapeutic activities;Patient/family education;Cognitive remediation/compensation    Plan  , manual and NMR for R scapula alignment/trunk and RUE horizontal reach, address shoulder pain prn, address cogntion within functional context     Consulted and Agree with Plan of Care  Patient       Patient will benefit from skilled therapeutic intervention in order to improve the following deficits and impairments:  Decreased activity tolerance, Decreased coordination, Decreased cognition, Decreased balance, Decreased strength, Impaired UE functional use, Impaired sensation, Impaired tone, Pain  Visit Diagnosis: Hemiplegia and hemiparesis following cerebral infarction affecting right non-dominant side (HCC)  Acute pain of right shoulder  Abnormal posture  Other symptoms and signs involving the nervous system  Cognitive social or emotional deficit following cerebral infarction    Problem List Patient Active Problem List   Diagnosis Date Noted  . TIA (transient ischemic attack) 02/14/2018  . Essential hypertension 02/14/2018     RINE,KATHRYN 03/04/2018, 4:15 PM  [facility] [address] [place], [place], [address] Phone: [phone]   Fax:  [phone]  Name: Ethan Reed MRN: [id number] Date of Birth: 1950-06-14

## 2018-03-06 ENCOUNTER — Ambulatory Visit: Payer: No Typology Code available for payment source | Admitting: Occupational Therapy

## 2018-03-06 ENCOUNTER — Emergency Department (HOSPITAL_COMMUNITY)
Admission: EM | Admit: 2018-03-06 | Discharge: 2018-03-06 | Disposition: A | Discharge status: Home / Self Care | Payer: No Typology Code available for payment source | Attending: Emergency Medicine | Admitting: Emergency Medicine

## 2018-03-06 DIAGNOSIS — M25511 Pain in right shoulder: Secondary | ICD-10-CM

## 2018-03-06 DIAGNOSIS — R293 Abnormal posture: Secondary | ICD-10-CM

## 2018-03-06 DIAGNOSIS — R29818 Other symptoms and signs involving the nervous system: Secondary | ICD-10-CM

## 2018-03-06 DIAGNOSIS — Z5321 Procedure and treatment not carried out due to patient leaving prior to being seen by health care provider: Secondary | ICD-10-CM | POA: Diagnosis not present

## 2018-03-06 DIAGNOSIS — I69353 Hemiplegia and hemiparesis following cerebral infarction affecting right non-dominant side: Secondary | ICD-10-CM

## 2018-03-06 DIAGNOSIS — R58 Hemorrhage, not elsewhere classified: Secondary | ICD-10-CM | POA: Diagnosis present

## 2018-03-06 DIAGNOSIS — I69315 Cognitive social or emotional deficit following cerebral infarction: Secondary | ICD-10-CM

## 2018-03-06 NOTE — ED Notes (Signed)
Pt left ED.

## 2018-03-06 NOTE — ED Triage Notes (Signed)
Pt reports having a bruise to R upper thigh present X few days. States he was DC a few weeks ago after having stroke and started on blood thinners.

## 2018-03-06 NOTE — Patient Instructions (Signed)
Slide both hands up the wall, 20 reps 1-2 x day, make super to keep shoulder blade down and not to elevate shoulder.  Perform 10 wall pushups, 1x per day, stop if you have pain

## 2018-03-06 NOTE — Therapy (Signed)
[facility] [address] [place], [place], [address] Phone: [phone]   Fax:  [phone]  Occupational Therapy Treatment  Patient Details  Name: Ethan Reed MRN: [id number] Date of Birth: 18-Oct-1950 Referring Provider (OT): Shelly Rubenstein (Pt's PCP)   Encounter Date: 03/06/2018  OT End of Session - 03/06/18 1419    Visit Number  5    Number of Visits  16    Date for OT Re-Evaluation  04/16/18    Authorization Type  [employer] (federal employee)  60 VL combined PT/OT/ST    OT Start Time  1406   2 units only pt in BR   OT Stop Time  1445    OT Time Calculation (min)  39 min       Past Medical History:  Diagnosis Date  . CVA (cerebral vascular accident) (HCC)   . Dysrhythmia   . Hypertension   . Medical history non-contributory     No past surgical history on file.  There were no vitals filed for this visit.  Subjective Assessment - 03/06/18 1546    Pertinent History  02/14/2018 L thalamic stroke d/c home on 02/15/2018.      Patient Stated Goals  better use of my arm, I have tingling in my fingers, get better control of my R leg its kind of lazy    Currently in Pain?  Yes    Pain Score  2     Pain Location  Arm    Pain Orientation  Right    Pain Descriptors / Indicators  Aching    Pain Type  Acute pain    Pain Onset  1 to 4 weeks ago    Pain Frequency  Intermittent    Aggravating Factors   malpositioning    Pain Relieving Factors  repositioning              Treatment:Prone on elbows lifting chest followed by quadraped lifiting alternate UE/ LE, min facilllitation/ v.c for core stability and UE strength Prone bilateral shoulder extension followed by unilateral RUE extension, followed by rows with 1 lbs weight min v.c for performance supine closed chain shoulder flexion and chest press, followed by chest press, then shoulder flexion with 3 lbs weight held between hands, min v.c and facilitation  for scapula/ shoulder position. Wall slides x 10 reps for overhead reach followed by wall pushups, 10 reps min v.c Issued wall slides and wall pushups as home work.              OT Short Term Goals - 03/06/18 1439      OT SHORT TERM GOAL #1   Title  +        OT Long Term Goals - 02/24/18 1201      OT LONG TERM GOAL #1   Title  Pt will be mod I with home activity program for RUE functional use and cognitive tasks prn - 04/16/2018    Status  On-going      OT LONG TERM GOAL #2   Title  Pt will demonstrate improved coordination RUE as evidenced by decreasing time on 9 hole peg test by at least 14 seconds to assist with fine motor tasks.     Baseline  baseline= 44.80    Status  On-going      OT LONG TERM GOAL #3   Title  Pt will report pain no greater than 3/10 in R shoulder with horizontal reach into high kitchen cabinet  during cooking tasks    Baseline  baseline = 7/10    Status  On-going      OT LONG TERM GOAL #4   Title  Pt will demonstrate ability to obtain glass mixing bowl from overhead cabinet with RUE without dropping.     Status  On-going      OT LONG TERM GOAL #5   Title  Pt will return to use of computer using both hands PRN for [employer] business    Status  On-going            Plan - 03/06/18 1419    Clinical Impression Statement  Pt is progressing towards goals. He demonstrates decreasing pain and improving RUE A/ROM/ control.    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  PMH:  HTN    Occupational performance deficits (Please refer to evaluation for details):  ADL's;IADL's;Work;Leisure;Social Participation    Rehab Potential  Good    OT Frequency  2x / week    OT Duration  8 weeks    OT Treatment/Interventions  Self-care/ADL training;Aquatic Therapy;Therapeutic exercise;Neuromuscular education;DME and/or AE instruction;Manual Therapy;Passive range of motion;Energy  conservation;Therapeutic activities;Patient/family education;Cognitive remediation/compensation    Plan  , manual and NMR for R scapula alignment/trunk and RUE horizontal reach, address shoulder pain prn, add to HEP prn- consider prone scapular stability    Consulted and Agree with Plan of Care  Patient       Patient will benefit from skilled therapeutic intervention in order to improve the following deficits and impairments:  Decreased activity tolerance, Decreased coordination, Decreased cognition, Decreased balance, Decreased strength, Impaired UE functional use, Impaired sensation, Impaired tone, Pain  Visit Diagnosis: No diagnosis found.    Problem List Patient Active Problem List   Diagnosis Date Noted  . TIA (transient ischemic attack) 02/14/2018  . Essential hypertension 02/14/2018    Toshi Ishii 03/06/2018, 3:47 PM  [facility] [address] [place], [place], [address] Phone: [phone]   Fax:  [phone]  Name: Ethan Reed MRN: [id number] Date of Birth: 12-10-1950

## 2018-03-07 ENCOUNTER — Ambulatory Visit: Payer: No Typology Code available for payment source | Admitting: Rehabilitative and Restorative Service Providers"

## 2018-03-07 ENCOUNTER — Encounter: Payer: Self-pay | Admitting: Rehabilitative and Restorative Service Providers"

## 2018-03-07 DIAGNOSIS — R29818 Other symptoms and signs involving the nervous system: Secondary | ICD-10-CM

## 2018-03-07 DIAGNOSIS — I69353 Hemiplegia and hemiparesis following cerebral infarction affecting right non-dominant side: Secondary | ICD-10-CM | POA: Diagnosis not present

## 2018-03-07 DIAGNOSIS — R293 Abnormal posture: Secondary | ICD-10-CM

## 2018-03-07 DIAGNOSIS — R2689 Other abnormalities of gait and mobility: Secondary | ICD-10-CM

## 2018-03-07 NOTE — Therapy (Signed)
[facility] [address] [place], [place], [address] Phone: [phone]   Fax:  [phone]  Physical Therapy Treatment and Discharge Summary  Patient Details  Name: Ethan Reed MRN: [id number] Date of Birth: 12-10-1950 Referring Provider (PT): Jonathon Jordan, MD (was referred by hospitalist, however will f/u with primary care MD).   Encounter Date: 03/07/2018  PT End of Session - 03/07/18 1628    Visit Number  2    Number of Visits  4    Date for PT Re-Evaluation  03/21/18    Authorization Type  [employer] 15% coinsurance    PT Start Time  1406    PT Stop Time  1444    PT Time Calculation (min)  38 min    Activity Tolerance  Patient tolerated treatment well    Behavior During Therapy  WFL for tasks assessed/performed       Past Medical History:  Diagnosis Date  . CVA (cerebral vascular accident) ([place])   . Dysrhythmia   . Hypertension   . Medical history non-contributory     History reviewed. No pertinent surgical history.  There were no vitals filed for this visit.  Subjective Assessment - 03/07/18 1409    Subjective  Patient reports that he still notes difficulty walking at his nomral speed.    No loss of balance.  He is going to see his MD this afternoon to check a spot on his right medial thigh (bruising).      Pertinent History  HTN, hyperlipidemia, acute stroke.    Patient Stated Goals  Major goal is to get back to 100% and get control of my arm, reduce tingling in hand and mouth.  Also get leg back "I've already started improving in my leg."  He also notes balance has improved.      Currently in Pain?  Yes   can be painful, depending on movements; none at rest        [facility] PT Assessment - 03/07/18 1425      Ambulation/Gait   Ambulation/Gait  Yes    Ambulation/Gait Assistance  7: Independent      Functional Gait  Assessment   Gait assessed   Yes    Gait Level Surface  Walks 20 ft in less  than 5.5 sec, no assistive devices, good speed, no evidence for imbalance, normal gait pattern, deviates no more than 6 in outside of the 12 in walkway width.    Change in Gait Speed  Able to smoothly change walking speed without loss of balance or gait deviation. Deviate no more than 6 in outside of the 12 in walkway width.    Gait with Horizontal Head Turns  Performs head turns smoothly with no change in gait. Deviates no more than 6 in outside 12 in walkway width    Gait with Vertical Head Turns  Performs head turns with no change in gait. Deviates no more than 6 in outside 12 in walkway width.    Gait and Pivot Turn  Pivot turns safely within 3 sec and stops quickly with no loss of balance.    Step Over Obstacle  Is able to step over 2 stacked shoe boxes taped together (9 in total height) without changing gait speed. No evidence of imbalance.    Gait with Narrow Base of Support  Is able to ambulate for 10 steps heel to toe with no staggering.    Gait with Eyes Closed  Walks 20 ft,  no assistive devices, good speed, no evidence of imbalance, normal gait pattern, deviates no more than 6 in outside 12 in walkway width. Ambulates 20 ft in less than 7 sec.    Ambulating Backwards  Walks 20 ft, uses assistive device, slower speed, mild gait deviations, deviates 6-10 in outside 12 in walkway width.    Steps  Alternating feet, no rail.    Total Score  29    FGA comment:  29/30, improved from 25/30                   [facility] Adult PT Treatment/Exercise - 03/07/18 1425      Self-Care   Self-Care  Other Self-Care Comments    Other Self-Care Comments   Discussed community exercise options.             PT Education - 03/07/18 1628    Education Details  walking program, updated/modified hamstring stretch    Person(s) Educated  Patient    Methods  Explanation;Demonstration;Handout    Comprehension  Verbalized understanding;Returned demonstration          PT Long Term Goals -  03/07/18 1426      PT LONG TERM GOAL #1   Title  The patient will be indep with HEP for high level balance and R motor control.    Time  4    Period  Weeks    Status  Achieved      PT LONG TERM GOAL #2   Title  The patient will improve FGA from 25/30 to > or equal to 28/30.    Baseline  29/30    Time  4    Period  Weeks    Status  Achieved      PT LONG TERM GOAL #3   Title  The patient will verbalize understanding of community exercis eoptions.    Baseline  prefers home walking program    Time  4    Period  Weeks    Status  Achieved            Plan - 03/07/18 1629    Clinical Impression Statement  The patient met LTGs and is independently able to progress a home walking program.  PT recommends he continue HEP + walking.    PT Treatment/Interventions  ADLs/Self Care Home Management;Therapeutic activities;Therapeutic exercise;Balance training;Gait training;Stair training;Functional mobility training;Patient/family education;Neuromuscular re-education    PT Next Visit Plan  discharge today.    Consulted and Agree with Plan of Care  Patient       Patient will benefit from skilled therapeutic intervention in order to improve the following deficits and impairments:  Abnormal gait, Decreased coordination, Decreased balance  Visit Diagnosis: Other symptoms and signs involving the nervous system  Abnormal posture  Other abnormalities of gait and mobility    PHYSICAL THERAPY DISCHARGE SUMMARY  Visits from Start of Care: 2  Current functional level related to goals / functional outcomes: See above   Remaining deficits: dec'd endurance   Education / Equipment: Home program, walking program.  Plan: Patient agrees to discharge.  Patient goals were met. Patient is being discharged due to meeting the stated rehab goals.  ?????          Thank you for the referral of this patient. Rudell Cobb, MPT   [place] , PT 03/07/2018, 4:30 PM  [facility] [address] [place], [place], [address] Phone: [phone]   Fax:  [phone]  Name: Ethan Reed MRN: [id number] Date of Birth: [birth year]

## 2018-03-07 NOTE — Patient Instructions (Addendum)
Adults should do at least 150-300 minutes a week of moderate-intensity, or 75-150 minutes a week of vigorous-intensity aerobic physical activity, or an equivalent combination of moderate- and vigorous-intensity aerobic activity. Adults should perform muscle-strengthening activities on 2 or more days a week. Older adults should do multicomponent physical activity that includes balance training as well as aerobic and muscle-strengthening activities. Benefits of increased physical activity include lower risk of mortality including cardiovascular mortality, lower risk of cardiovascular events and associated risk factors (hypertension and diabetes), and lower risk of many cancers (including bladder, breast, colon, endometrium, esophagus, kidney, lung, and stomach). Additional improvments have been seen in cognition, risk of dementia, anxiety and depression, improved bone health, lower risk of falls, and associated injuries.   WALKING  Walking is a great form of exercise to increase your strength, endurance and overall fitness.  A walking program can help you start slowly and gradually build endurance as you go.  Everyone's ability is different, so each person's starting point will be different.  You do not have to follow them exactly.  The are just samples. You should simply find out what's right for you and stick to that program.   In the beginning, you'll start off walking 2-3 times a day for short distances.  As you get stronger, you'll be walking further at just 1-2 times per day.  A. You Can Walk For A Certain Length Of Time Each Day    Walk 20 minutes 2 times per day.    GOAL:  Work up to 40-45 minutes nonstop of walking at a moderate pace (still able to carry on a conversation).    Access Code: [email or web address]  URL: [email or web address]  Date: 03/07/2018  Prepared by: Margretta Ditty   Exercises Tandem Walking with Counter Support - 8 reps - 5 sets - 1x daily - 7x weekly Toe Walking  - 10 reps - 5 sets - 1x daily - 7x weekly Seated Table Hamstring Stretch - 2-3 reps - 1 sets - 30 seconds hold - 2x daily - 7x weekly

## 2018-03-10 ENCOUNTER — Encounter: Payer: Self-pay | Admitting: Occupational Therapy

## 2018-03-10 ENCOUNTER — Ambulatory Visit: Payer: No Typology Code available for payment source | Admitting: Occupational Therapy

## 2018-03-10 DIAGNOSIS — R29818 Other symptoms and signs involving the nervous system: Secondary | ICD-10-CM

## 2018-03-10 DIAGNOSIS — I69353 Hemiplegia and hemiparesis following cerebral infarction affecting right non-dominant side: Secondary | ICD-10-CM | POA: Diagnosis not present

## 2018-03-10 DIAGNOSIS — I69315 Cognitive social or emotional deficit following cerebral infarction: Secondary | ICD-10-CM

## 2018-03-10 DIAGNOSIS — R293 Abnormal posture: Secondary | ICD-10-CM

## 2018-03-10 DIAGNOSIS — M25511 Pain in right shoulder: Secondary | ICD-10-CM

## 2018-03-10 DIAGNOSIS — R270 Ataxia, unspecified: Secondary | ICD-10-CM

## 2018-03-10 NOTE — Therapy (Signed)
[facility] [address] [place], [place], [address] Phone: [phone]   Fax:  [phone]  Occupational Therapy Treatment  Patient Details  Name: Ethan Reed MRN: [id number] Date of Birth: [birth year] Referring Provider (OT): Shelly RubensteinSusan Wolters (Pt's PCP)   Encounter Date: 03/10/2018  OT End of Session - 03/10/18 1108    Visit Number  6    Number of Visits  16    Date for OT Re-Evaluation  04/16/18    Authorization Type  [employer] (federal employee)  60 VL combined PT/OT/ST    OT Start Time  1020   pt arrived late   OT Stop Time  1101    OT Time Calculation (min)  41 min    Activity Tolerance  Patient tolerated treatment well       Past Medical History:  Diagnosis Date  . CVA (cerebral vascular accident) (HCC)   . Dysrhythmia   . Hypertension   . Medical history non-contributory     History reviewed. No pertinent surgical history.  There were no vitals filed for this visit.  Subjective Assessment - 03/10/18 1022    Subjective   I am doing those exericises "relatively"    Pertinent History  02/14/2018 L thalamic stroke d/c home on 02/15/2018.      Patient Stated Goals  better use of my arm, I have tingling in my fingers, get better control of my R leg its kind of lazy    Currently in Pain?  Yes    Pain Score  2     Pain Location  Arm    Pain Orientation  Right    Pain Descriptors / Indicators  Aching    Pain Type  Acute pain    Pain Onset  1 to 4 weeks ago    Pain Frequency  Intermittent    Aggravating Factors   malpositioning, when I sleep on it or turn it a certain way in bed    Pain Relieving Factors  repositioning, avoiding those movements                   OT Treatments/Exercises (OP) - 03/10/18 0001      Neurological Re-education Exercises   Other Exercises 1  Neuro re ed to address proximal strength, stability and stable mobility first in supine (chest presses and overhead reach  with 2 pound weight in each hand).  Transitioned into sidelying to address scap retraction with elbow extension using 2 pound weight. Transitioned back into supine and addressed functional reach patterns using PNF pattens with 1 pound wrist weight.  Transitioned into prone to address scapular stability.  Also worked in quadruped lifting first one UE while stabilizing with other three extremities then stabilizing with RUE and LLE while lifting LUE and RLE.  Also addressed coordination and in hand manpulation following signficant proximal work to improve distal coordination and control                OT Short Term Goals - 03/10/18 1104      OT SHORT TERM GOAL #1   Title  Pt will be mod I with HEP for R hand coordination, RUE proximal strength - 03/19/2018    Status  Achieved      OT SHORT TERM GOAL #2   Title  Pt will demonstrate improved coordination RUE as evidenced by decreasing time on 9 hole peg test by at least 7 seconds to assist with fine motor tasks.  Baseline  baseline= 44.80    Status  On-going      OT SHORT TERM GOAL #3   Title  Complete MOCA and establish cognitive goal if appropriate    Status  Achieved      OT SHORT TERM GOAL #4   Title  Pt will report pain no greater than 4/10 in R shoulder when using RUE to bath L shoulder    Status  On-going        OT Long Term Goals - 03/10/18 1107      OT LONG TERM GOAL #1   Title  Pt will be mod I with home activity program for RUE functional use and cognitive tasks prn - 04/16/2018    Status  On-going      OT LONG TERM GOAL #2   Title  Pt will demonstrate improved coordination RUE as evidenced by decreasing time on 9 hole peg test by at least 14 seconds to assist with fine motor tasks.     Baseline  baseline= 44.80    Status  On-going      OT LONG TERM GOAL #3   Title  Pt will report pain no greater than 3/10 in R shoulder with horizontal reach into high kitchen cabinet during cooking tasks    Baseline  baseline =  7/10    Status  On-going      OT LONG TERM GOAL #4   Title  Pt will demonstrate ability to obtain glass mixing bowl from overhead cabinet with RUE without dropping.     Status  On-going      OT LONG TERM GOAL #5   Title  Pt will return to use of computer using both hands PRN for [employer] business    Status  On-going            Plan - 03/10/18 1107    Clinical Impression Statement  Pt progressing toward goals. Pt reports some muscle discomfort in RUE (bicep and tricep) but no shoulder joint pain.     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  PMH:  HTN    Occupational performance deficits (Please refer to evaluation for details):  ADL's;IADL's;Work;Leisure;Social Participation    Rehab Potential  Good    OT Frequency  2x / week    OT Duration  8 weeks    OT Treatment/Interventions  Self-care/ADL training;Aquatic Therapy;Therapeutic exercise;Neuromuscular education;DME and/or AE instruction;Manual Therapy;Passive range of motion;Energy conservation;Therapeutic activities;Patient/family education;Cognitive remediation/compensation    Plan  , manual and NMR for R scapula alignment/trunk and RUE horizontal reach, address shoulder pain prn, add to HEP prn- consider prone scapular stability    Consulted and Agree with Plan of Care  Patient       Patient will benefit from skilled therapeutic intervention in order to improve the following deficits and impairments:  Decreased activity tolerance, Decreased coordination, Decreased cognition, Decreased balance, Decreased strength, Impaired UE functional use, Impaired sensation, Impaired tone, Pain  Visit Diagnosis: Other symptoms and signs involving the nervous system  Abnormal posture  Acute pain of right shoulder  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 TIA (transient ischemic attack) 02/14/2018  . Essential hypertension 02/14/2018    Norton Pastel, OTR/L 03/10/2018, 11:10 AM  [facility] [address] [place], [place], [address] Phone: [phone]   Fax:  [phone]  Name: Ethan Reed MRN: [id number] Date of Birth: Mar 25, 1950

## 2018-03-13 ENCOUNTER — Ambulatory Visit: Payer: No Typology Code available for payment source | Admitting: Occupational Therapy

## 2018-03-13 ENCOUNTER — Encounter: Payer: Self-pay | Admitting: Occupational Therapy

## 2018-03-13 DIAGNOSIS — R293 Abnormal posture: Secondary | ICD-10-CM

## 2018-03-13 DIAGNOSIS — M25511 Pain in right shoulder: Secondary | ICD-10-CM

## 2018-03-13 DIAGNOSIS — R270 Ataxia, unspecified: Secondary | ICD-10-CM

## 2018-03-13 DIAGNOSIS — I69353 Hemiplegia and hemiparesis following cerebral infarction affecting right non-dominant side: Secondary | ICD-10-CM

## 2018-03-13 DIAGNOSIS — R29818 Other symptoms and signs involving the nervous system: Secondary | ICD-10-CM

## 2018-03-13 DIAGNOSIS — I69315 Cognitive social or emotional deficit following cerebral infarction: Secondary | ICD-10-CM

## 2018-03-13 NOTE — Therapy (Signed)
[facility] [address] [place], [place], [address] Phone: [phone]   Fax:  [phone]  Occupational Therapy Treatment  Patient Details  Name: Ethan Reed MRN: [id number] Date of Birth: [birth year] Referring Provider (OT): Shelly Rubenstein (Pt's PCP)   Encounter Date: 03/13/2018  OT End of Session - 03/13/18 1625    Visit Number  7    Number of Visits  16    Date for OT Re-Evaluation  04/16/18    Authorization Type  [employer] (federal employee)  60 VL combined PT/OT/ST    OT Start Time  1532    OT Stop Time  1614    OT Time Calculation (min)  42 min    Activity Tolerance  Patient tolerated treatment well       Past Medical History:  Diagnosis Date  . CVA (cerebral vascular accident) (HCC)   . Dysrhythmia   . Hypertension   . Medical history non-contributory     History reviewed. No pertinent surgical history.  There were no vitals filed for this visit.  Subjective Assessment - 03/13/18 1535    Subjective   My right arm gets tired    Pertinent History  02/14/2018 L thalamic stroke d/c home on 02/15/2018.      Patient Stated Goals  better use of my arm, I have tingling in my fingers, get better control of my R leg its kind of lazy    Currently in Pain?  Yes    Pain Score  2     Pain Location  Arm    Pain Orientation  Right    Pain Descriptors / Indicators  Aching    Pain Type  Acute pain    Pain Onset  1 to 4 weeks ago    Pain Frequency  Intermittent    Aggravating Factors   certain positions, when I sleep on it.      Pain Relieving Factors  repositioning, avoiding those movements.                     OT Treatments/Exercises (OP) - 03/13/18 0001      Neurological Re-education Exercises   Other Exercises 1  Neuro re ed to address proximal stability, stable mobility with mid to overhead reach using body weight as resistance in standing, working through full range of shoulder flexion as  well as shoulder abduction.  Also addressed coordination and in hand manipulation with R hand for functional tasks. Pt with improving performance and decreasing RUE pain.                OT Short Term Goals - 03/13/18 1623      OT SHORT TERM GOAL #1   Title  Pt will be mod I with HEP for R hand coordination, RUE proximal strength - 03/19/2018    Status  Achieved      OT SHORT TERM GOAL #2   Title  Pt will demonstrate improved coordination RUE as evidenced by decreasing time on 9 hole peg test by at least 7 seconds to assist with fine motor tasks.    Baseline  baseline= 44.80    Status  On-going      OT SHORT TERM GOAL #3   Title  Complete MOCA and establish cognitive goal if appropriate    Status  Achieved      OT SHORT TERM GOAL #4   Title  Pt will report pain no greater than 4/10 in R  shoulder when using RUE to bath L shoulder    Status  Achieved   03/13/18   2/10       OT Long Term Goals - 03/13/18 1624      OT LONG TERM GOAL #1   Title  Pt will be mod I with home activity program for RUE functional use and cognitive tasks prn - 04/16/2018    Status  On-going      OT LONG TERM GOAL #2   Title  Pt will demonstrate improved coordination RUE as evidenced by decreasing time on 9 hole peg test by at least 14 seconds to assist with fine motor tasks.     Baseline  baseline= 44.80    Status  On-going      OT LONG TERM GOAL #3   Title  Pt will report pain no greater than 3/10 in R shoulder with horizontal reach into high kitchen cabinet during cooking tasks    Baseline  baseline = 7/10    Status  On-going      OT LONG TERM GOAL #4   Title  Pt will demonstrate ability to obtain glass mixing bowl from overhead cabinet with RUE without dropping.     Status  On-going      OT LONG TERM GOAL #5   Title  Pt will return to use of computer using both hands PRN for [employer] business    Status  On-going            Plan - 03/13/18 1624    Clinical Impression Statement  Pt  progressing toward goals. 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  PMH:  HTN    Occupational performance deficits (Please refer to evaluation for details):  ADL's;IADL's;Work;Leisure;Social Participation    Rehab Potential  Good    OT Frequency  2x / week    OT Duration  8 weeks    OT Treatment/Interventions  Self-care/ADL training;Aquatic Therapy;Therapeutic exercise;Neuromuscular education;DME and/or AE instruction;Manual Therapy;Passive range of motion;Energy conservation;Therapeutic activities;Patient/family education;Cognitive remediation/compensation    Plan  , manual and NMR for R scapula alignment/trunk and RUE horizontal reach, address shoulder pain prn, add to HEP prn- consider prone scapular stability    Consulted and Agree with Plan of Care  Patient       Patient will benefit from skilled therapeutic intervention in order to improve the following deficits and impairments:  Decreased activity tolerance, Decreased coordination, Decreased cognition, Decreased balance, Decreased strength, Impaired UE functional use, Impaired sensation, Impaired tone, Pain  Visit Diagnosis: Other symptoms and signs involving the nervous system  Abnormal posture  Acute pain of right shoulder  Hemiplegia and hemiparesis following cerebral infarction affecting right non-dominant side (HCC)  Cognitive social or emotional deficit following cerebral infarction  Ataxia    Problem List Patient Active Problem List   Diagnosis Date Noted  . TIA (transient ischemic attack) 02/14/2018  . Essential hypertension 02/14/2018    Norton Pastel, OTR/L 03/13/2018, 4:26 PM   [facility] [address] [place], [place], [address] Phone: [phone]   Fax:  [phone]  Name: ZAMEIR WALTON MRN:  [id number] Date of Birth: 1950/02/27

## 2018-03-17 ENCOUNTER — Ambulatory Visit: Payer: No Typology Code available for payment source | Admitting: Occupational Therapy

## 2018-03-17 ENCOUNTER — Encounter: Payer: Self-pay | Admitting: Occupational Therapy

## 2018-03-17 DIAGNOSIS — M25511 Pain in right shoulder: Secondary | ICD-10-CM

## 2018-03-17 DIAGNOSIS — I69353 Hemiplegia and hemiparesis following cerebral infarction affecting right non-dominant side: Secondary | ICD-10-CM | POA: Diagnosis not present

## 2018-03-17 DIAGNOSIS — I69315 Cognitive social or emotional deficit following cerebral infarction: Secondary | ICD-10-CM

## 2018-03-17 DIAGNOSIS — R293 Abnormal posture: Secondary | ICD-10-CM

## 2018-03-17 DIAGNOSIS — R270 Ataxia, unspecified: Secondary | ICD-10-CM

## 2018-03-17 DIAGNOSIS — R29818 Other symptoms and signs involving the nervous system: Secondary | ICD-10-CM

## 2018-03-17 NOTE — Therapy (Signed)
[facility] [address] [place], [place], [address] Phone: [phone]   Fax:  [phone]  Occupational Therapy Treatment  Patient Details  Name: Ethan Reed MRN: [id number] Date of Birth: 15-Jul-1950 Referring Provider (OT): Macie Burows (Pt's PCP)   Encounter Date: 03/17/2018  OT End of Session - 03/17/18 1202    Visit Number  8    Number of Visits  16    Date for OT Re-Evaluation  04/16/18    Authorization Type  [employer] (federal employee)  60 VL combined PT/OT/ST    OT Start Time  1109   pt arrived late   OT Stop Time  1145    OT Time Calculation (min)  36 min    Activity Tolerance  Patient tolerated treatment well       Past Medical History:  Diagnosis Date  . CVA (cerebral vascular accident) ([employer])   . Dysrhythmia   . Hypertension   . Medical history non-contributory     History reviewed. No pertinent surgical history.  There were no vitals filed for this visit.  Subjective Assessment - 03/17/18 1110    Subjective   The pain in my right arm is slowly getting better - I only get it once in awhile and its about 1/10 now. Its the muscle not the shoulder joint.     Pertinent History  02/14/2018 L thalamic stroke d/c home on 02/15/2018.      Patient Stated Goals  better use of my arm, I have tingling in my fingers, get better control of my R leg its kind of lazy    Currently in Pain?  Yes    Pain Score  1     Pain Location  Arm    Pain Orientation  Right    Pain Descriptors / Indicators  Aching    Pain Type  Acute pain    Pain Onset  1 to 4 weeks ago    Pain Frequency  Intermittent    Aggravating Factors   certain positions, its the muscle not the shoulder joint    Pain Relieving Factors  repositioning, avoiding those movements                   OT Treatments/Exercises (OP) - 03/17/18 0001      ADLs   ADL Comments  Checked STG's - see goal section for details.  Pt has met all  STG's and 1 LTG.  Pt has made good progress with RUE control and functional use.  Pt with signficant questions related to diagnosis, stroke prevention, etc.Reviewed and provided pt with written handouts that discussed stroke warning signs and symptoms, risk factors and healhty diet. Pt also given website for American heart association and American stroke association.  Pt also educated on contacting dietary dept for questions regarding diet.  Pt also reports continued slowing of higher level cognitive processes and is now open to ST eval. Message sent to pt's PCP with pt's approval to request order. Pt verbalized understanding of all information.              OT Education - 03/17/18 1159    Education Details  signs and symptoms of stroke, risk factors, dietary concerns.    Person(s) Educated  Patient    Methods  Explanation;Handout    Comprehension  Verbalized understanding       OT Short Term Goals - 03/17/18 1200      OT SHORT TERM GOAL #1  Title  Pt will be mod I with HEP for R hand coordination, RUE proximal strength - 03/19/2018    Status  Achieved      OT SHORT TERM GOAL #2   Title  Pt will demonstrate improved coordination RUE as evidenced by decreasing time on 9 hole peg test by at least 7 seconds to assist with fine motor tasks.    Baseline  baseline= 44.80    Status  Achieved   03/17/2018  30/18 seconds     OT SHORT TERM GOAL #3   Title  Complete MOCA and establish cognitive goal if appropriate    Status  Achieved      OT SHORT TERM GOAL #4   Title  Pt will report pain no greater than 4/10 in R shoulder when using RUE to bath L shoulder    Status  Achieved   03/13/18   2/10       OT Long Term Goals - 03/17/18 1200      OT LONG TERM GOAL #1   Title  Pt will be mod I with home activity program for RUE functional use and cognitive tasks prn - 04/16/2018    Status  On-going      OT LONG TERM GOAL #2   Title  Pt will demonstrate improved coordination RUE as evidenced  by decreasing time on 9 hole peg test by at least 14 seconds to assist with fine motor tasks.     Baseline  baseline= 44.80    Status  Achieved   03/17/2018  30.18 seconds     OT LONG TERM GOAL #3   Title  Pt will report pain no greater than 3/10 in R shoulder with horizontal reach into high kitchen cabinet during cooking tasks    Baseline  baseline = 7/10    Status  Achieved      OT LONG TERM GOAL #4   Title  Pt will demonstrate ability to obtain glass mixing bowl from overhead cabinet with RUE without dropping.     Status  On-going      OT LONG TERM GOAL #5   Title  Pt will return to use of computer using both hands PRN for [employer] business    Status  On-going            Plan - 03/17/18 1201    Clinical Impression Statement  Pt progressing toward goals and has met all STG's at this time.  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  PMH:  HTN    Occupational performance deficits (Please refer to evaluation for details):  ADL's;IADL's;Work;Leisure;Social Participation    Rehab Potential  Good    OT Frequency  2x / week    OT Duration  8 weeks    OT Treatment/Interventions  Self-care/ADL training;Aquatic Therapy;Therapeutic exercise;Neuromuscular education;DME and/or AE instruction;Manual Therapy;Passive range of motion;Energy conservation;Therapeutic activities;Patient/family education;Cognitive remediation/compensation    Plan  , manual and NMR for R scapula alignment/trunk and RUE horizontal reach, address shoulder pain prn, add to HEP prn- consider prone scapular stability    Consulted and Agree with Plan of Care  Patient       Patient will benefit from skilled therapeutic intervention in order to improve the following deficits and impairments:  Decreased activity tolerance, Decreased coordination, Decreased cognition, Decreased balance, Decreased strength, Impaired UE  functional use, Impaired sensation, Impaired tone, Pain  Visit Diagnosis: Other symptoms  and signs involving the nervous system  Abnormal posture  Acute pain of right shoulder  Hemiplegia and hemiparesis following cerebral infarction affecting right non-dominant side (HCC)  Cognitive social or emotional deficit following cerebral infarction  Ataxia    Problem List Patient Active Problem List   Diagnosis Date Noted  . TIA (transient ischemic attack) 02/14/2018  . Essential hypertension 02/14/2018    Quay Burow, OTR/L 03/17/2018, 12:03 PM  [facility] [address] [place], [place], [address] Phone: [phone]   Fax:  [phone]  Name: Ethan Reed MRN: [id number] Date of Birth: 1951/01/13

## 2018-03-20 ENCOUNTER — Ambulatory Visit: Payer: No Typology Code available for payment source | Admitting: Occupational Therapy

## 2018-03-20 ENCOUNTER — Encounter: Payer: Self-pay | Admitting: Occupational Therapy

## 2018-03-20 DIAGNOSIS — I69353 Hemiplegia and hemiparesis following cerebral infarction affecting right non-dominant side: Secondary | ICD-10-CM

## 2018-03-20 DIAGNOSIS — M25511 Pain in right shoulder: Secondary | ICD-10-CM

## 2018-03-20 DIAGNOSIS — R29818 Other symptoms and signs involving the nervous system: Secondary | ICD-10-CM

## 2018-03-20 DIAGNOSIS — R293 Abnormal posture: Secondary | ICD-10-CM

## 2018-03-20 NOTE — Therapy (Signed)
[facility] [address] [place], [place], [address] Phone: [phone]   Fax:  [phone]  Occupational Therapy Treatment  Patient Details  Name: Ethan Reed MRN: [id number] Date of Birth: 04-Jul-1950 Referring Provider (OT): Shelly Rubenstein (Pt's PCP)   Encounter Date: 03/20/2018  OT End of Session - 03/20/18 1600    Visit Number  9    Number of Visits  16    Date for OT Re-Evaluation  04/16/18    Authorization Type  [employer] (federal employee)  60 VL combined PT/OT/ST    OT Start Time  1449    OT Stop Time  1530    OT Time Calculation (min)  41 min    Activity Tolerance  Patient tolerated treatment well    Behavior During Therapy  [facility] for tasks assessed/performed       Past Medical History:  Diagnosis Date  . CVA (cerebral vascular accident) (HCC)   . Dysrhythmia   . Hypertension   . Medical history non-contributory     History reviewed. No pertinent surgical history.  There were no vitals filed for this visit.  Subjective Assessment - 03/20/18 1455    Subjective   I've been using my R arm a lot more, it doesn't hurt as much.     Pertinent History  02/14/2018 L thalamic stroke d/c home on 02/15/2018.      Patient Stated Goals  better use of my arm, I have tingling in my fingers, get better control of my R leg its kind of lazy    Currently in Pain?  No/denies           Pt engaged in overhead and horizontal reach kitchen activity incorporating functional reach movement patterns. Pt reaching towards varying planes/cabinet height at and above shoulder height with min difficulty. Graded difficulty of activity using 2lb wrist weight when reaching for and replacing items in cabinet to simulate reaching for heavier kitchen items (as pt reports his places are a little heavy). Pt reports pain x1 during activity with overhead reach reporting pain 2/10, with no additional reports of pain with remaining portions  of activity/reaching. Pt reports min shoulder muscle fatigue after activity completion.  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 Pt self-reports he has been working some at home, but with increased effort using RUE. Pt requiring increased time to complete typing activity today, though does report increased ease of task completion compared to previous attempts for task at home.     Pt is moving rapidly towards LTGs, discussed with pt potential for discharge after next treatment session, pt in agreement.                  OT Short Term Goals - 03/17/18 1200      OT SHORT TERM GOAL #1   Title  Pt will be mod I with HEP for R hand coordination, RUE proximal strength - 03/19/2018    Status  Achieved      OT SHORT TERM GOAL #2   Title  Pt will demonstrate improved coordination RUE as evidenced by decreasing time on 9 hole peg test by at least 7 seconds to assist with fine motor tasks.    Baseline  baseline= 44.80    Status  Achieved   03/17/2018  30/18 seconds     OT SHORT TERM GOAL #3   Title  Complete MOCA and establish cognitive goal  if appropriate    Status  Achieved      OT SHORT TERM GOAL #4   Title  Pt will report pain no greater than 4/10 in R shoulder when using RUE to bath L shoulder    Status  Achieved   03/13/18   2/10       OT Long Term Goals - 03/17/18 1200      OT LONG TERM GOAL #1   Title  Pt will be mod I with home activity program for RUE functional use and cognitive tasks prn - 04/16/2018    Status  On-going      OT LONG TERM GOAL #2   Title  Pt will demonstrate improved coordination RUE as evidenced by decreasing time on 9 hole peg test by at least 14 seconds to assist with fine motor tasks.     Baseline  baseline= 44.80    Status  Achieved   03/17/2018  30.18 seconds     OT LONG TERM GOAL #3   Title  Pt will report pain no greater than 3/10 in R shoulder with horizontal reach  into high kitchen cabinet during cooking tasks    Baseline  baseline = 7/10    Status  Achieved      OT LONG TERM GOAL #4   Title  Pt will demonstrate ability to obtain glass mixing bowl from overhead cabinet with RUE without dropping.     Status  On-going      OT LONG TERM GOAL #5   Title  Pt will return to use of computer using both hands PRN for [employer] business    Status  On-going            Plan - 03/20/18 1600    Clinical Impression Statement  Pt making steady progress towards LTGs and is close to meeting final goals.     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  PMH:  HTN    Occupational performance deficits (Please refer to evaluation for details):  ADL's;IADL's;Work;Leisure;Social Participation    Rehab Potential  Good    OT Frequency  2x / week    OT Duration  8 weeks    OT Treatment/Interventions  Self-care/ADL training;Aquatic Therapy;Therapeutic exercise;Neuromuscular education;DME and/or AE instruction;Manual Therapy;Passive range of motion;Energy conservation;Therapeutic activities;Patient/family education;Cognitive remediation/compensation    Plan  review/address unmet goals; use of RUE for functional/horizontal reach in overhead cabinet; use of bil hands for computer work     [employer] and Agree with Plan of Care  Patient       Patient will benefit from skilled therapeutic intervention in order to improve the following deficits and impairments:  Decreased activity tolerance, Decreased coordination, Decreased cognition, Decreased balance, Decreased strength, Impaired UE functional use, Impaired sensation, Impaired tone, Pain  Visit Diagnosis: Other symptoms and signs involving the nervous system  Abnormal posture  Acute pain of right shoulder  Hemiplegia and hemiparesis following cerebral infarction affecting right non-dominant side [facility])    Problem List Patient Active Problem  List   Diagnosis Date Noted  . TIA (transient ischemic attack) 02/14/2018  . Essential hypertension 02/14/2018    Orlando Penner, OTR/L  03/20/2018, 4:04 PM  [facility] [address] [place], [place], [address] Phone: [phone]   Fax:  [phone]  Name: Ethan Reed MRN: [id number] Date of Birth: 11-Feb-1950

## 2018-03-24 ENCOUNTER — Encounter: Payer: Self-pay | Admitting: Occupational Therapy

## 2018-03-24 ENCOUNTER — Ambulatory Visit: Payer: No Typology Code available for payment source | Attending: Internal Medicine | Admitting: Occupational Therapy

## 2018-03-24 DIAGNOSIS — I69353 Hemiplegia and hemiparesis following cerebral infarction affecting right non-dominant side: Secondary | ICD-10-CM | POA: Diagnosis present

## 2018-03-24 DIAGNOSIS — M25511 Pain in right shoulder: Secondary | ICD-10-CM | POA: Insufficient documentation

## 2018-03-24 DIAGNOSIS — R293 Abnormal posture: Secondary | ICD-10-CM | POA: Diagnosis present

## 2018-03-24 DIAGNOSIS — R270 Ataxia, unspecified: Secondary | ICD-10-CM | POA: Diagnosis present

## 2018-03-24 DIAGNOSIS — R41841 Cognitive communication deficit: Secondary | ICD-10-CM | POA: Insufficient documentation

## 2018-03-24 NOTE — Therapy (Signed)
[facility] [address] [place], [place], [address] Phone: [phone]   Fax:  [phone]  Occupational Therapy Treatment  Patient Details  Name: Ethan Reed MRN: [id number] Date of Birth: [birth year] Referring Provider (OT): Macie Burows (Pt's PCP)   Encounter Date: 03/24/2018  OT End of Session - 03/24/18 1149    Visit Number  10    Number of Visits  16    Date for OT Re-Evaluation  04/16/18    Authorization Type  [employer] (federal employee)  60 VL combined PT/OT/ST    OT Start Time  1102    OT Stop Time  1131    OT Time Calculation (min)  29 min    Activity Tolerance  Patient tolerated treatment well    Behavior During Therapy  [facility] for tasks assessed/performed       Past Medical History:  Diagnosis Date  . CVA (cerebral vascular accident) ([place])   . Dysrhythmia   . Hypertension   . Medical history non-contributory     History reviewed. No pertinent surgical history.  There were no vitals filed for this visit.  Subjective Assessment - 03/24/18 1105    Subjective   I haven't done much computer work this past week but I have been using my R arm more, it's getting easier.     Pertinent History  02/14/2018 L thalamic stroke d/c home on 02/15/2018.      Patient Stated Goals  better use of my arm, I have tingling in my fingers, get better control of my R leg its kind of lazy    Currently in Pain?  No/denies          Pt has made great progress with OT and has met all OT LTGs. Pt engaging in functional reach activity in kitchen obtaining items required to complete baking recipe including food items, mixing bowl, measuring cups and baking pans. Pt using RUE for reaching/obtaining all items from overhead cabinets throughout kitchen and using bil UEs appropriately when lifting and transporting heavier items from higher to lower surfaces. Practiced carrying plate in RUE while balancing small items on plate with  good stability noted throughout.  Pt completed seated computer activity typing short descriptions using bil hands for typing task. Pt demonstrates good use of RUE throughout and reports feeling about at his baseline with use of RUE during computer work. Discussed with pt his progress and having met LTGs. Pt in agreement with plan for discharge and reports feeling comfortable continuing HEPs at home.                    OT Short Term Goals - 03/17/18 1200      OT SHORT TERM GOAL #1   Title  Pt will be mod I with HEP for R hand coordination, RUE proximal strength - 03/19/2018    Status  Achieved      OT SHORT TERM GOAL #2   Title  Pt will demonstrate improved coordination RUE as evidenced by decreasing time on 9 hole peg test by at least 7 seconds to assist with fine motor tasks.    Baseline  baseline= 44.80    Status  Achieved   03/17/2018  30/18 seconds     OT SHORT TERM GOAL #3   Title  Complete MOCA and establish cognitive goal if appropriate    Status  Achieved      OT SHORT TERM GOAL #4   Title  Pt  will report pain no greater than 4/10 in R shoulder when using RUE to bath L shoulder    Status  Achieved   03/13/18   2/10       OT Long Term Goals - 03/24/18 1201      OT LONG TERM GOAL #1   Title  Pt will be mod I with home activity program for RUE functional use and cognitive tasks prn - 04/16/2018    Status  Achieved      OT LONG TERM GOAL #2   Title  Pt will demonstrate improved coordination RUE as evidenced by decreasing time on 9 hole peg test by at least 14 seconds to assist with fine motor tasks.     Baseline  baseline= 44.80    Status  Achieved   03/17/2018  30.18 seconds     OT LONG TERM GOAL #3   Title  Pt will report pain no greater than 3/10 in R shoulder with horizontal reach into high kitchen cabinet during cooking tasks    Baseline  baseline = 7/10    Status  Achieved      OT LONG TERM GOAL #4   Title  Pt will demonstrate ability to obtain glass  mixing bowl from overhead cabinet with RUE without dropping.     Status  Achieved      OT LONG TERM GOAL #5   Title  Pt will return to use of computer using both hands PRN for [employer] business    Status  Achieved            Plan - 03/24/18 1149    Clinical Impression Statement  Pt has made good progress with OT and has met all LTGs.    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  PMH:  HTN    Occupational performance deficits (Please refer to evaluation for details):  ADL's;IADL's;Work;Leisure;Social Participation    Rehab Potential  Good    OT Frequency  2x / week    OT Duration  8 weeks    OT Treatment/Interventions  Self-care/ADL training;Aquatic Therapy;Therapeutic exercise;Neuromuscular education;DME and/or AE instruction;Manual Therapy;Passive range of motion;Energy conservation;Therapeutic activities;Patient/family education;Cognitive remediation/compensation    Plan  Pt has met all OT goals, ready for discharge from OT, pt in agreement.     Consulted and Agree with Plan of Care  Patient       Patient will benefit from skilled therapeutic intervention in order to improve the following deficits and impairments:     Visit Diagnosis: Abnormal posture  Acute pain of right shoulder  Hemiplegia and hemiparesis following cerebral infarction affecting right non-dominant side ([place])  Ataxia    Problem List Patient Active Problem List   Diagnosis Date Noted  . TIA (transient ischemic attack) 02/14/2018  . Essential hypertension 02/14/2018   OCCUPATIONAL THERAPY DISCHARGE SUMMARY  Visits from Start of Care: 10  Current functional level related to goals / functional outcomes: See above   Remaining deficits: Mild R incoordination, higher level cognitive deficits - ST to address   Education / Equipment: HEP Plan: Patient agrees to discharge.  Patient goals were met. Patient is being  discharged due to meeting the stated rehab goals.  ?????     Raymondo Band, OTR/L 03/24/2018, 12:02 PM  [facility] [address] [place], [place], [address] Phone: [phone]   Fax:  [phone]  Name: Ethan Reed MRN: [id number] Date of Birth:  09/10/1950 

## 2018-03-27 ENCOUNTER — Ambulatory Visit: Payer: No Typology Code available for payment source | Admitting: Occupational Therapy

## 2018-03-31 ENCOUNTER — Ambulatory Visit: Payer: No Typology Code available for payment source | Admitting: Speech Pathology

## 2018-03-31 ENCOUNTER — Encounter: Payer: No Typology Code available for payment source | Admitting: Occupational Therapy

## 2018-03-31 ENCOUNTER — Telehealth: Payer: Self-pay | Admitting: Speech Pathology

## 2018-03-31 DIAGNOSIS — R41841 Cognitive communication deficit: Secondary | ICD-10-CM

## 2018-03-31 NOTE — Therapy (Signed)
Pt arrived and SLP initiated cognitive-linguistic testing after pt interview; per OT notes pt with mild deficits in alternating and divided attention, working memory, mental manipulation, and minor fluency in language. Shortly after testing began, as SLP provided feedback re: cognitive tasks, pt stated, "We're done here," and left the treatment room. SLP confirmed pt did not want to complete testing. Visit arrived, no charge for today's session. Pt does not wish to pursue speech therapy at this time.  [name], MS, CCC-SLP Speech-Language Pathologist    [facility] [address] [place], [place], [address] Phone: [phone]   Fax:  [phone]  Patient Details  Name: Ethan Reed MRN: [id number] Date of Birth: 08/09/1950 Referring Provider:  Mila Palmer, MD  Encounter Date: 03/31/2018   [name] 03/31/2018, 12:45 PM  [facility] [address] [place], [place], [address] Phone: [phone]   Fax:  [phone]

## 2018-03-31 NOTE — Telephone Encounter (Signed)
SLP called pt to follow up after pt walked out of evaluation session today. Pt stated he felt feedback received during evaluation was "to make me feel small." I apologized, stating this was not the purpose or intention. I informed pt that I had messaged his PCP and that pt welcome to return for evaluation with a different therapist if he would like to pursue this. Pt informed me that he called the front office and has already rescheduled.   [name], MS, Sports administrator

## 2018-04-01 NOTE — Progress Notes (Signed)
[facility]  PATIENT: Ethan Reed DOB: 1950-07-21   REASON FOR VISIT: Follow-up for acute CVA with risk factors of hypertension hyperlipidemia mild obesity and daytime somnolence HISTORY FROM: Patient    HISTORY OF PRESENT ILLNESS: From hospital record Ethan Reed is a 68 y.o. male with a medical history of hypertension, who presented to the emergency department with complaints of right-sided weakness and numbness which started  02/14/2018.  He states he has been having intermittent episodes lasting approximately 25 minutes of right arm and leg weakness as well as discoordination with his right arm.  He has felt some numbness in his fingertips as well.  Patient also states he had some slurred speech this morning along with dizziness.  He denied any chest pain, shortness of breath, abdominal pain, nausea or vomiting, diarrhea or constipation, problems with dysuria, hematuria, headache, visual changes, neck pain, recent illness or travel.  He does admit to not taking his medications for blood pressure as directed. Patient was evaluated by Dr. Roda Shutters on 02/15/2018 PT , OT recommended.  MRI showed left thalamic infarct.No associated hemorrhage or mass-effect. -CT head normal -Chest x-ray and UA reviewed, unremarkable for infection. -CTA head and neck showed patent carotid and vertebral arteries.  No dissection, aneurysm, hemodynamically significant stenosis.  Patent anterior and posterior intracranial circulation.  No large vessel occlusion, aneurysm, significant stenosis. -Hemoglobin A1c 5.8,  LDL 150 -Echocardiogram showed an EF of 60 to 65% -EEG normal negative for seizure No antithrombotic prior to admission -recommended aspirin 81 mg daily and clopidogrel 75 mg daily.  Continue DAPT for 3 weeks and aspirin alone.  Follow-up with neurology in 4 weeks. 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  He also has risk factors of hypertension hyperlipidemia mild obesity and daytime somnolence.  He has never had a sleep study.  Initially he had some right arm and leg weakness.  He has received physical therapy and occupational therapy and that has concluded.  He claims he is going to get some speech therapy for cognitive issues.  He complains of daytime drowsiness.  He gets little exercise since his retirement last year.  Blood pressure in the office today 130/71.  He is taking his lisinopril as directed.  He remains on aspirin for secondary stroke prevention without recurrent stroke or TIA symptoms.  He is on Lipitor without myalgias.  ESS score 10. FSS 56.  He returns for reevaluation  REVIEW OF SYSTEMS: Full 14 system review of systems performed and notable only for those listed, all others are neg:  Constitutional: neg  Cardiovascular: neg Ear/Nose/Throat: neg  Skin: neg Eyes: neg Respiratory: neg Gastroitestinal: neg  Hematology/Lymphatic: neg  Endocrine: neg Musculoskeletal:neg Allergy/Immunology: neg Neurological:  occasional numbness in the right hand  Psychiatric: neg Sleep : Daytime drowsiness   ALLERGIES: Allergies  Allergen Reactions  . Oxycodone Nausea And Vomiting and Other (See Comments)    Dizziness    HOME MEDICATIONS: Outpatient Medications Prior to Visit  Medication Sig Dispense Refill  . aspirin EC 81 MG EC tablet Take 1 tablet (81 mg total) by mouth daily. 30 tablet 0  . atorvastatin (LIPITOR) 40 MG tablet Take 1 tablet (40 mg total) by mouth daily at 6 PM. 30 tablet 0  . clopidogrel (PLAVIX) 75 MG tablet Take 1 tablet (75 mg total) by mouth daily. 21 tablet 0  . lisinopril-hydrochlorothiazide (PRINZIDE,ZESTORETIC) 10-12.5 MG tablet Take  1 tablet by mouth daily.     No facility-administered medications prior to visit.     PAST MEDICAL HISTORY: Past Medical History:  Diagnosis Date  . CVA (cerebral vascular  accident) ([facility])   . Dysrhythmia   . Hypertension   . Medical history non-contributory     PAST SURGICAL HISTORY: History reviewed. No pertinent surgical history.  FAMILY HISTORY: Family History  Problem Relation Age of Onset  . Heart attack Mother   . Lung cancer Father   . Cancer Sister     SOCIAL HISTORY: Social History   Socioeconomic History  . Marital status: Widowed    Spouse name: Not on file  . Number of children: Not on file  . Years of education: Not on file  . Highest education level: Not on file  Occupational History  . Not on file  Social Needs  . Financial resource strain: Not on file  . Food insecurity:    Worry: Patient refused    Inability: Patient refused  . Transportation needs:    Medical: Patient refused    Non-medical: Patient refused  Tobacco Use  . Smoking status: Never Smoker  . Smokeless tobacco: Never Used  Substance and Sexual Activity  . Alcohol use: No  . Drug use: No  . Sexual activity: Yes  Lifestyle  . Physical activity:    Days per week: Not on file    Minutes per session: Not on file  . Stress: Not on file  Relationships  . Social connections:    Talks on phone: Not on file    Gets together: Not on file    Attends religious service: Not on file    Active member of club or organization: Not on file    Attends meetings of clubs or organizations: Not on file    Relationship status: Not on file  . Intimate partner violence:    Fear of current or ex partner: Patient refused    Emotionally abused: Patient refused    Physically abused: Patient refused    Forced sexual activity: Patient refused  Other Topics Concern  . Not on file  Social History Narrative   Live alone.  Retired Research officer, political party.  Children grown-3, one local.  Caffeine: non caffeine.  Education:  HS.       PHYSICAL EXAM  Vitals:   04/02/18 0835  BP: 130/71  Pulse: 73  Weight: 197 lb 3.2 oz (89.4 kg)  Height: 6' (1.829 m)   Body mass index is 26.75  kg/m.  Generalized: Well developed, in no acute distress  Head: normocephalic and atraumatic,. Oropharynx benign  Neck: Supple, no carotid bruits  Cardiac: Regular rate rhythm, no murmur  Musculoskeletal: No deformity   Neurological examination   Mentation: Alert oriented to time, place, history taking. Attention span and concentration appropriate. Recent and remote memory intact.  Follows all commands speech and language fluent.   Cranial nerve II-XII: Pupils were equal round reactive to light extraocular movements were full, visual field were full on confrontational test. Facial sensation and strength were normal. hearing was intact to finger rubbing bilaterally. Uvula tongue midline. head turning and shoulder shrug were normal and symmetric.Tongue protrusion into cheek strength was normal. Motor: normal bulk and tone, full strength in the BUE, BLE, fine finger movements normal, no pronator drift. No focal weakness Sensory: normal and symmetric to light touch, pinprick, and  Vibration, in the upper and lower extremities Coordination: finger-nose-finger, heel-to-shin bilaterally, no dysmetria Reflexes: Symmetric upper and lower,  plantar responses were flexor bilaterally. Gait and Station: Rising up from seated position without assistance, normal stance,  moderate stride, good arm swing, smooth turning, able to perform tiptoe, and heel walking without difficulty. Tandem gait is steady, no assistive device DIAGNOSTIC DATA (LABS, IMAGING, TESTING) - I reviewed patient records, labs, notes, testing and imaging myself where available.  Lab Results  Component Value Date   WBC 5.3 02/14/2018   HGB 16.3 02/14/2018   HCT 49.0 02/14/2018   MCV 91.4 02/14/2018   PLT 184 02/14/2018      Component Value Date/Time   NA 139 02/14/2018 1443   K 3.6 02/14/2018 1443   CL 106 02/14/2018 1443   CO2 23 02/14/2018 1443   GLUCOSE 103 (H) 02/14/2018 1443   BUN 12 02/14/2018 1443   CREATININE 0.93  02/14/2018 1443   CALCIUM 9.2 02/14/2018 1443   PROT 7.6 02/14/2018 1443   ALBUMIN 3.8 02/14/2018 1443   AST 18 02/14/2018 1443   ALT 23 02/14/2018 1443   ALKPHOS 56 02/14/2018 1443   BILITOT 0.9 02/14/2018 1443   GFRNONAA >60 02/14/2018 1443   GFRAA >60 02/14/2018 1443   Lab Results  Component Value Date   CHOL 214 (H) 02/15/2018   HDL 34 (L) 02/15/2018   LDLCALC 150 (H) 02/15/2018   TRIG 148 02/15/2018   CHOLHDL 6.3 02/15/2018   Lab Results  Component Value Date   HGBA1C 5.8 (H) 02/15/2018     ASSESSMENT AND PLAN Ethan Reed is a 68 y.o. male with history of hypertension and hyperlipidemia presenting with right-sided arm and leg numbness, wax and wane with dizziness/lightheadedness. He did not receive IV t-PA due to mild symptoms.  Patient has a new complaint of daytime somnolence.  He has never had a sleep study.   MRI of the brain left thalmic stroke.  CT head normal -Chest x-ray and UA reviewed, unremarkable for infection. -CTA head and neck showed patent carotid and vertebral arteries.  No dissection, aneurysm, hemodynamically significant stenosis.  Patent anterior and posterior intracranial circulation.  No large vessel occlusion, aneurysm, significant stenosis. -Hemoglobin A1c 5.8,  LDL 150 -Echocardiogram showed an EF of 60 to 65% -EEG normal negative for seizure The patient was seen bu Dr. Roda ShuttersXu in the hospital to follow-up in the stroke clinic This note is sent to the work in doctor.      PLAN: Stressed the importance of management of risk factors to prevent further stroke Continue aspirin for secondary stroke prevention Maintain strict control of hypertension with blood pressure goal below 130/90, today's reading 130/71 continue antihypertensive medications Control of diabetes with hemoglobin A1c below 6.5 followed by primary care most recent hemoglobin A1c 5.8 Cholesterol with LDL cholesterol less than 70, followed by primary care,  most recent 150,   continue statin drug Lipitor Exercise by walking, slowly increase , eat healthy diet with whole grains,  fresh fruits and vegetables Follow-up with primary care for stroke risk factor modification, maintain blood pressure goal less than 130 systolic, diabetes with A1c below 7, lipids with LDL below 70 F/U in 6 months Will obtain sleep study ESS score 10. FSS 56  .Discussed risk for recurrent stroke/ TIA and answered additional questions, and given written printout.  Also discussed risk of untreated sleep apnea. This was a prolonged visit requiring 35 minutes and medical decision making of high complexity with extensive review of history, hospital chart, counseling and answering questions Nilda RiggsNancy Carolyn Milfred Krammes, [facility]heastern Regional Medical CenterGNP, [facility] C.F.S.E.BC, APRN  [facility]  [address], [place], [place] [address] [phone]

## 2018-04-02 ENCOUNTER — Ambulatory Visit (INDEPENDENT_AMBULATORY_CARE_PROVIDER_SITE_OTHER): Payer: No Typology Code available for payment source | Admitting: Nurse Practitioner

## 2018-04-02 ENCOUNTER — Other Ambulatory Visit: Payer: Self-pay

## 2018-04-02 ENCOUNTER — Encounter: Payer: Self-pay | Admitting: Nurse Practitioner

## 2018-04-02 VITALS — BP 130/71 | HR 73 | Ht 72.0 in | Wt 197.2 lb

## 2018-04-02 DIAGNOSIS — E785 Hyperlipidemia, unspecified: Secondary | ICD-10-CM | POA: Insufficient documentation

## 2018-04-02 DIAGNOSIS — R400 Somnolence: Secondary | ICD-10-CM | POA: Diagnosis not present

## 2018-04-02 DIAGNOSIS — I639 Cerebral infarction, unspecified: Secondary | ICD-10-CM | POA: Diagnosis not present

## 2018-04-02 DIAGNOSIS — I10 Essential (primary) hypertension: Secondary | ICD-10-CM | POA: Diagnosis not present

## 2018-04-02 DIAGNOSIS — I63332 Cerebral infarction due to thrombosis of left posterior cerebral artery: Secondary | ICD-10-CM

## 2018-04-02 NOTE — Patient Instructions (Addendum)
PLAN: Stressed the importance of management of risk factors to prevent further stroke Continue aspirin for secondary stroke prevention Maintain strict control of hypertension with blood pressure goal below 130/90, today's reading 130/71 continue antihypertensive medications Control of diabetes with hemoglobin A1c below 6.5 followed by primary care most recent hemoglobin A1c 5.8 Cholesterol with LDL cholesterol less than 70, followed by primary care,  most recent 150,  continue statin drug Lipitor Exercise by walking, slowly increase , eat healthy diet with whole grains,  fresh fruits and vegetables Follow-up with primary care for stroke risk factor modification, maintain blood pressure goal less than 130 systolic, diabetes with A1c below 7, lipids with LDL below 70 F/U in 6 months Will obtain sleep study and .      Ischemic Stroke  An ischemic stroke (cerebrovascular accident, or CVA) is the sudden death of brain tissue that occurs when an area of the brain does not get enough oxygen. It is a medical emergency that must be treated right away. An ischemic stroke can cause permanent loss of brain function. This can cause problems with how different parts of your body function. What are the causes? This condition is caused by a decrease of oxygen supply to an area of the brain, which may be the result of:  A small blood clot (embolus) or a buildup of plaque in the blood vessels (atherosclerosis) that blocks blood flow in the brain.  An abnormal heart rhythm (atrial fibrillation).  A blocked or damaged artery in the head or neck. Sometimes the cause of stroke is not known (cryptogenic). What increases the risk? Certain factors may make you more likely to develop this condition. Some of these factors are things that you can change, such as:  Obesity.  Smoking cigarettes.  Taking oral birth control, especially if you also use tobacco.  Physical inactivity.  Excessive alcohol use.   Use of illegal drugs, especially cocaine and methamphetamine. Other risk factors include:  High blood pressure (hypertension).  High cholesterol.  Diabetes mellitus.  Heart disease.  Being [place] American, Native [address], Hispanic, or [place] Native.  Being over age 71.  Family history of stroke.  Previous history of blood clots, stroke, or transient ischemic attack (TIA).  Sickle cell disease.  Being a woman with a history of preeclampsia.  Migraine headache.  Sleep apnea.  Irregular heartbeats, such as atrial fibrillation.  Chronic inflammatory diseases, such as rheumatoid arthritis or lupus.  Blood clotting disorders (hypercoagulable state). What are the signs or symptoms? Symptoms of this condition usually develop suddenly, or you may notice them after waking up from sleep. Symptoms may include sudden:  Weakness or numbness in your face, arm, or leg, especially on one side of your body.  Trouble walking or difficulty moving your arms or legs.  Loss of balance or coordination.  Confusion.  Slurred speech (dysarthria).  Trouble speaking, understanding speech, or both (aphasia).  Vision changes-such as double vision, blurred vision, or loss of vision-in one or both eyes.  Dizziness.  Nausea and vomiting.  Severe headache with no known cause. The headache is often described as the worst headache ever experienced. If possible, make note of the exact time that you last felt like your normal self and what time your symptoms started. Tell your health care provider. If symptoms come and go, this could be a sign of a warning stroke, or TIA. Get help right away, even if you feel better. How is this diagnosed? This condition may be diagnosed based on:  Your symptoms, your medical history, and a physical exam.  CT scan of the brain.  MRI.  CT angiogram. This test uses a computer to take X-rays of your arteries. A dye may be injected into your blood to show the  inside of your blood vessels more clearly.  MRI angiogram. This is a type of MRI that is used to evaluate the blood vessels.  Cerebral angiogram. This test uses X-rays and a dye to show the blood vessels in the brain and neck. You may need to see a health care provider who specializes in stroke care. A stroke specialist can be seen in person or through communication using telephone or television technology (telemedicine). Other tests may also be done to find the cause of the stroke, such as:  Electrocardiogram (ECG).  Continuous heart monitoring.  Echocardiogram.  Transesophageal echocardiogram (TEE).  Carotid ultrasound.  A scan of the brain circulation.  Blood tests.  Sleep study to check for sleep apnea. How is this treated? Treatment for this condition will depend on the duration, severity, and cause of your symptoms and on the area of the brain affected. It is very important to get treatment at the first sign of stroke symptoms. Some treatments work better if they are done within 3-6 hours of the onset of stroke symptoms. These initial treatments may include:  Aspirin.  Medicines to control blood pressure.  Medicine given by injection to dissolve the blood clot (thrombolytic).  Treatments given directly to the affected artery to remove or dissolve the blood clot. Other treatment options may include:  Oxygen.  IV fluids.  Medicines to thin the blood (anticoagulants or antiplatelets).  Procedures to increase blood flow. Medicines and changes to your diet may be used to help treat and manage risk factors for stroke, such as diabetes, high cholesterol, and high blood pressure. After a stroke, you may work with physical, speech, mental health, or occupational therapists to help you recover. Follow these instructions at home: Medicines  Take over-the-counter and prescription medicines only as told by your health care provider.  If you were told to take a medicine to  thin your blood, such as aspirin or an anticoagulant, take it exactly as told by your health care provider. ? Taking too much blood-thinning medicine can cause bleeding. ? If you do not take enough blood-thinning medicine, you will not have the protection that you need against another stroke and other problems.  Understand the side effects of taking anticoagulant medicine. When taking this type of medicine, make sure you: ? Hold pressure over any cuts for longer than usual. ? Tell your dentist and other health care providers that you are taking anticoagulants before you have any procedures that may cause bleeding. ? Avoid activities that may cause trauma or injury. Eating and drinking  Follow instructions from your health care provider about diet.  Eat healthy foods.  If your ability to swallow was affected by the stroke, you may need to take steps to avoid choking, such as: ? Taking small bites when eating. ? Eating foods that are soft or pureed. Safety  Follow instructions from your health care team about physical activity.  Use a walker or cane as told by your health care provider.  Take steps to create a safe home environment in order to reduce the risk of falls. This may include: ? Having your home looked at by specialists. ? Installing grab bars in the bedroom and bathroom. ? Using safety equipment, such as raised toilets  and a seat in the shower. General instructions  Do not use any tobacco products, such as cigarettes, chewing tobacco, and e-cigarettes. If you need help quitting, ask your health care provider.  Limit alcohol intake to no more than 1 drink a day for nonpregnant women and 2 drinks a day for men. One drink equals 12 oz of beer, 5 oz of wine, or 1 oz of hard liquor.  If you need help to stop using drugs or alcohol, ask your health care provider about a referral to a program or specialist.  Maintain an active and healthy lifestyle. Get regular exercise as told  by your health care provider.  Keep all follow-up visits as told by your health care provider, including visits with all specialists on your health care team. This is important. How is this prevented? Your risk of another stroke can be decreased by managing high blood pressure, high cholesterol, diabetes, heart disease, sleep apnea, and obesity. It can also be decreased by quitting smoking, limiting alcohol, and staying physically active. Your health care provider will continue to work with you on measures to prevent short-term and long-term complications of stroke. Get help right away if:   You have any symptoms of a stroke. "BE FAST" is an easy way to remember the main warning signs of a stroke: ? B - Balance. Signs are dizziness, sudden trouble walking, or loss of balance. ? E - Eyes. Signs are trouble seeing or a sudden change in vision. ? F - Face. Signs are sudden weakness or numbness of the face, or the face or eyelid drooping on one side. ? A - Arms. Signs are weakness or numbness in an arm. This happens suddenly and usually on one side of the body. ? S - Speech. Signs are sudden trouble speaking, slurred speech, or trouble understanding what people say. ? T - Time. Time to call emergency services. Write down what time symptoms started.  You have other signs of a stroke, such as: ? A sudden, severe headache with no known cause. ? Nausea or vomiting. ? Seizure.  These symptoms may represent a serious problem that is an emergency. Do not wait to see if the symptoms will go away. Get medical help right away. Call your local emergency services (911 in the [place] Do not drive yourself to the hospital. Summary  An ischemic stroke (cerebrovascular accident, or CVA) is the sudden death of brain tissue that occurs when an area of the brain does not get enough oxygen.  Symptoms of this condition usually develop suddenly, or you may notice them after waking up from sleep.  It is very  important to get treatment at the first sign of stroke symptoms. Stroke is a medical emergency that must be treated right away. This information is not intended to replace advice given to you by your health care provider. Make sure you discuss any questions you have with your health care provider. Document Released: 01/08/2005 Document Revised: 09/27/2017 Document Reviewed: 04/06/2015 Elsevier Interactive Patient Education  2019 [employer].

## 2018-04-03 ENCOUNTER — Encounter: Payer: No Typology Code available for payment source | Admitting: Occupational Therapy

## 2018-05-13 ENCOUNTER — Telehealth: Payer: Self-pay | Admitting: Neurology

## 2018-05-13 NOTE — Telephone Encounter (Signed)
Due to current COVID 19 pandemic, our office is severely reducing in office visits until further notice, in order to minimize the risk to our patients and healthcare providers.   Called patient and scheduled him for a virtual visit. Patient verbalized understanding of the Webex process and understands that he will receive an e-mail with instructions as well as the link he will need to use for connection. Patient understands that he will receive a call from RN and front office staff.  Pt understands that although there may be some limitations with this type of visit, we will take all precautions to reduce any security or privacy concerns.  Pt understands that this will be treated like an in office visit and we will file with pt's insurance, and there may be a patient responsible charge related to this service.  Pt's email is [email or web address]. Pt understands that the [employer] webex software must be downloaded and operational on the device pt plans to use for the visit.

## 2018-05-20 ENCOUNTER — Encounter: Payer: Self-pay | Admitting: Neurology

## 2018-05-20 NOTE — Telephone Encounter (Signed)
Called pt to review and he was not by medications. Reviewed the rest of his chart with him as well as made sure that he had the app downloaded which he did. Pt verbalized understanding.  PT will call in the morning to review the meds he is on pharmacy already confirmed.

## 2018-05-20 NOTE — Telephone Encounter (Signed)
Called the patient to review his chart with him. He was driving and unable to review at this time. Patient asked I call back around 4 pm

## 2018-05-21 NOTE — Telephone Encounter (Signed)
Patient calling to review meds prior to visit with Dr Vickey Huger tomorrow. Please call him at [phone]

## 2018-05-21 NOTE — Addendum Note (Signed)
Addended by: Judi Cong on: 05/21/2018 12:36 PM   Modules accepted: Orders

## 2018-05-21 NOTE — Telephone Encounter (Signed)
Called the patient and reviewed meds.

## 2018-05-21 NOTE — Telephone Encounter (Signed)
Called the patient and reviewed the medications.

## 2018-05-22 ENCOUNTER — Other Ambulatory Visit: Payer: Self-pay

## 2018-05-22 ENCOUNTER — Ambulatory Visit (INDEPENDENT_AMBULATORY_CARE_PROVIDER_SITE_OTHER): Payer: No Typology Code available for payment source | Admitting: Neurology

## 2018-05-22 ENCOUNTER — Encounter: Payer: Self-pay | Admitting: Neurology

## 2018-05-22 DIAGNOSIS — I6381 Other cerebral infarction due to occlusion or stenosis of small artery: Secondary | ICD-10-CM | POA: Insufficient documentation

## 2018-05-22 DIAGNOSIS — I10 Essential (primary) hypertension: Secondary | ICD-10-CM

## 2018-05-22 DIAGNOSIS — I6322 Cerebral infarction due to unspecified occlusion or stenosis of basilar arteries: Secondary | ICD-10-CM

## 2018-05-22 DIAGNOSIS — R400 Somnolence: Secondary | ICD-10-CM

## 2018-05-22 DIAGNOSIS — G459 Transient cerebral ischemic attack, unspecified: Secondary | ICD-10-CM

## 2018-05-22 DIAGNOSIS — I63212 Cerebral infarction due to unspecified occlusion or stenosis of left vertebral arteries: Secondary | ICD-10-CM

## 2018-05-22 DIAGNOSIS — R0683 Snoring: Secondary | ICD-10-CM

## 2018-05-22 NOTE — Patient Instructions (Signed)
Screening for Sleep Apnea  Sleep apnea is a condition in which breathing pauses or becomes shallow during sleep. Sleep apnea screening is a test to determine if you are at risk for sleep apnea. The test is easy and only takes a few minutes. Your health care provider may ask you to have this test in preparation for surgery or as part of a physical exam. What are the symptoms of sleep apnea? Common symptoms of sleep apnea include:  Snoring.  Restless sleep.  Daytime sleepiness.  Pauses in breathing.  Choking during sleep.  Irritability.  Forgetfulness.  Trouble thinking clearly.  Depression.  Personality changes. Most people with sleep apnea are not aware that they have it. Why should I get screened? Getting screened for sleep apnea can help:  Ensure your safety. It is important for your health care providers to know whether or not you have sleep apnea, especially if you are having surgery or have other long-term (chronic) health conditions.  Improve your health and allow you to get a better night's rest. Restful sleep can help you: ? Have more energy. ? Lose weight. ? Improve high blood pressure. ? Improve diabetes management. ? Prevent stroke. ? Prevent car accidents. How is screening done? Screening usually includes being asked a list of questions about your sleep quality. Some questions you may be asked include:  Do you snore?  Is your sleep restless?  Do you have daytime sleepiness?  Has a partner or spouse told you that you stop breathing during sleep?  Have you had trouble concentrating or memory loss? If your screening test is positive, you are at risk for the condition. Further testing may be needed to confirm a diagnosis of sleep apnea. Where to find more information You can find screening tools online or at your health care clinic. For more information about sleep apnea screening and healthy sleep, visit these websites:  Centers for Disease Control and  Prevention: [employer]ail[employer].is  American Sleep Apnea Association: www.sleepapnea.org Contact a health care provider if:  You think that you may have sleep apnea. Summary  Sleep apnea screening can help determine if you are at risk for sleep apnea.  It is important for your health care providers to know whether or not you have sleep apnea, especially if you are having surgery or have other chronic health conditions.  You may be asked to take a screening test for sleep apnea in preparation for surgery or as part of a physical exam. This information is not intended to replace advice given to you by your health care provider. Make sure you discuss any questions you have with your health care provider. Document Released: 04/20/2016 Document Revised: 04/20/2016 Document Reviewed: 04/20/2016 Elsevier Interactive Patient Education  2019 [employer]. Hypersomnia Hypersomnia is a condition in which a person feels very tired during the day even though he or she gets plenty of sleep at night. A person with this condition may take naps during the day and may find it very difficult to wake up from sleep. Hypersomnia may affect a person's ability to think, concentrate, drive, or remember things. What are the causes? The cause of this condition may not be known. Possible causes include:  Certain medicines.  Sleep disorders, such as narcolepsy and sleep apnea.  Injury to the head, brain, or spinal cord.  Drug or alcohol use.  Gastroesophageal reflux disease (GERD).  Tumors.  Certain medical conditions, such as depression, diabetes, or an underactive thyroid gland (hypothyroidism). What are the signs or  symptoms? The main symptoms of hypersomnia include:  Feeling very tired throughout the day, regardless of how much sleep you got the night before.  Having trouble waking up. Others may find it difficult to wake you up when you are sleeping.  Sleeping for longer and longer periods at  a time.  Taking naps throughout the day. Other symptoms may include:  Feeling restless, anxious, or annoyed.  Lacking energy.  Having trouble with: ? Remembering. ? Speaking. ? Thinking.  Loss of appetite.  Seeing, hearing, tasting, smelling, or feeling things that are not real (hallucinations). How is this diagnosed? This condition may be diagnosed based on:  Your symptoms and medical history.  Your sleeping habits. Your health care provider may ask you to write down your sleeping habits in a daily sleep log, along with any symptoms you have.  A series of tests that are done while you sleep (sleep study or polysomnogram).  A test that measures how quickly you can fall asleep during the day (daytime nap study or multiple sleep latency test). How is this treated? Treatment can help you manage your condition. Treatment may include:  Following a regular sleep routine.  Lifestyle changes, such as changing your eating habits, getting regular exercise, and avoiding alcohol or caffeinated beverages.  Taking medicines to make you more alert (stimulants) during the day.  Treating any underlying medical causes of hypersomnia. Follow these instructions at home: Sleep routine   Schedule the same bedtime and wake-up time each day.  Practice a relaxing bedtime routine. This may include reading, meditation, deep breathing, or taking a warm bath before going to sleep.  Get regular exercise each day. Avoid strenuous exercise in the evening hours.  Keep your sleep environment at a cooler temperature, darkened, and quiet.  Sleep with pillows and a mattress that are comfortable and supportive.  Schedule short 20-minute naps for when you feel sleepiest during the day.  Talk with your employer or teachers about your hypersomnia. If possible, adjust your schedule so that: ? You have a regular daytime work schedule. ? You can take a scheduled nap during the day. ? You do not have to  work or be active at night.  Do not eat a heavy meal for a few hours before bedtime. Eat your meals at about the same times every day.  Avoid drinking alcohol or caffeinated beverages. Safety   Do not drive or use heavy machinery if you are sleepy. Ask your health care provider if it is safe for you to drive.  Wear a life jacket when swimming or spending time near water. General instructions  Take supplements and over-the-counter and prescription medicines only as told by your health care provider.  Keep a sleep log that will help your doctor manage your condition. This may include information about: ? What time you go to bed each night. ? How often you wake up at night. ? How many hours you sleep at night. ? How often and for how long you nap during the day. ? Any observations from others, such as leg movements during sleep, sleep walking, or snoring.  Keep all follow-up visits as told by your health care provider. This is important. Contact a health care provider if:  You have new symptoms.  Your symptoms get worse. Get help right away if:  You have serious thoughts about hurting yourself or someone else. If you ever feel like you may hurt yourself or others, or have thoughts about taking your own life,  get help right away. You can go to your nearest emergency department or call:  Your local emergency services (911 in the [place]  A suicide crisis helpline, such as the National Suicide Prevention Lifeline at (864) 830-61231-(903) 577-3921. This is open 24 hours a day. Summary  Hypersomnia refers to a condition in which you feel very tired during the day even though you get plenty of sleep at night.  A person with this condition may take naps during the day and may find it very difficult to wake up from sleep.  Hypersomnia may affect a person's ability to think, concentrate, drive, or remember things.  Treatment, such as following a regular sleep routine and making some lifestyle changes,  can help you manage your condition. This information is not intended to replace advice given to you by your health care provider. Make sure you discuss any questions you have with your health care provider. Document Released: 12/29/2001 Document Revised: 01/10/2017 Document Reviewed: 01/10/2017 Elsevier Interactive Patient Education  2019 [employer]

## 2018-05-22 NOTE — Progress Notes (Signed)
[facility]  PATIENT: Ethan Reed DOB: 1950-07-23   REASON FOR VISIT: Follow-up for acute CVA with risk factors of hypertension hyperlipidemia mild obesity and daytime somnolence HISTORY FROM: Patient  Virtual Visit via Video Note  I connected with [name] on 05/22/18 at 11:00 AM EDT by a video enabled telemedicine application and verified that I am speaking with the correct person using two identifiers.  Location: Patient:  Home  Provider: Dr Vickey Huger, MD  I discussed the limitations of evaluation and management by telemedicine and the availability of in person appointments. The patient expressed understanding and agreed to proceed. Melvyn Novas, MD 05-22-2018   SLEEP MEDICINE CLINIC   Provider:  Melvyn Novas, M D  Primary Care Physician:  Mila Palmer, MD   Referring Provider: Darrol Angel, NP   HPI:  Ethan Reed is a 68 y.o.  African-American male stroke patient , seen here  in a referral from Darrol Angel, NP, for a sleep study evaluation-   Sleep and medical history:   The patient has HTN,  Controlled on 2 meds. GERD.  Has a cardiac arrhythmia - unspecified. 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 There is an entry  in his EPIC chart about hepatic coma ( ?).   The patient suffered a stroke in Januay 2020, a left thalamic stroke. Noted he was clumsy when he started to get dressed the morning of the stroke, not sure he woke up with the symptom He has finished OT, PT and did not need ST he reported.  He speaks slowly, is easily distracted during this sleep interview. He reported being left handed and therfore less impaired by the thalamic stroke affecting the right hand , shoulder and face, numbness on the right lip, right tongue and clumsiness initially when typing , writing, his gait had changed.   Quote: Pt arrived and SLP initiated cognitive-linguistic testing after pt interview; per OT notes pt with  mild deficits in alternating and divided attention, working memory, mental manipulation, and minor fluency in language. Shortly after testing began, as SLP provided feedback re: cognitive tasks, pt stated, "We're done here," and left the treatment room. SLP confirmed pt did not want to complete testing. Visit arrived, no charge for today's session. Pt does not wish to pursue speech therapy at this time."  Patient has a new complaint of daytime somnolence.  He has never had a sleep study.   MRI of the brain left thalmic stroke.  CT head normal -Chest x-ray and UA reviewed, unremarkable for infection. -CTA head and neck showed patent carotid and vertebral arteries.  No dissection, aneurysm, hemodynamically significant stenosis.  Patent anterior and posterior intracranial circulation.  No large vessel occlusion, aneurysm, significant stenosis. -Hemoglobin A1c 5.8,  LDL 150 -Echocardiogram showed an EF of 60 to 65% -EEG normal negative for seizure      Chief complaint according to patient : none sleep related.  Family sleep history: no family history of sleep apnea.    Social history: retired from the [employer], now runs his own business, online. Widowed,for  10 years. 3 adult children, all out, one sister lives 10 minutes away, fiancee 10 minutes away. Non drinker, non smoker- never, caffeine- sodas 3 a day, quit after stroke. Hot tea- herbal non caffeinated. No coffee now. Regular walking.   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 Now 8 o'clock. Bedroom entered at 11 PM, actually watching TV from the bed- until 1-2 AM, asleep while watching. Sleeps on his sides. Sleeps on multiple pillows.    Stays asleep for 3-4 hours, has one bathroom break, not every night.  Rising time 7-9 AM.  Breakfast at 10.30- 11 AM.   Not taking naps.   Review of Systems:   He speaks slowly, is easily  distracted during this sleep interview. He reported being left handed and therfore less impaired by the thalamic stroke affecting the right hand , shoulder and face, numbness on the right lip, right tongue and clumsiness initially when typing , writing, his gait had changed.    Out of a complete 14 system review, the patient complains of only the following symptoms, and all other reviewed systems are negative. How likely are you to doze in the following situations: 0 = not likely, 1 = slight chance, 2 = moderate chance, 3 = high chance  Sitting and Reading? 3 Watching Television? 3 Sitting inactive in a public place (theater or meeting)?1 Lying down in the afternoon when circumstances permit?1 Sitting and talking to someone?0 Sitting quietly after lunch without alcohol?0 In a car, while stopped for a few minutes in traffic?0 As a passenger in a car for an hour without a break? 3  Total =  10/ 24   Fatigue ; n/a , depression n/a   Snoring , wakes with a dry mouth.     Social History   Socioeconomic History   Marital status: Widowed    Spouse name: Not on file   Number of children: Not on file   Years of education: Not on file   Highest education level: Not on file  Occupational History   Not on file  Social Needs   Financial resource strain: Not on file   Food insecurity:    Worry: Patient refused    Inability: Patient refused   Transportation needs:    Medical: Patient refused    Non-medical: Patient refused  Tobacco Use   Smoking status: Never Smoker   Smokeless tobacco: Never Used  Substance and Sexual Activity   Alcohol use: No   Drug use: No   Sexual activity: Yes  Lifestyle   Physical activity:    Days per week: Not on file    Minutes per session: Not on file   Stress: Not on file  Relationships   Social connections:    Talks on phone: Not on file    Gets together: Not on file    Attends religious service: Not on file    Active member of  club or organization: Not on file    Attends meetings of clubs or organizations: Not on file    Relationship status: Not on file   Intimate partner violence:    Fear of current or ex partner: Patient refused    Emotionally abused: Patient refused    Physically abused: Patient refused    Forced sexual activity: Patient refused  Other Topics Concern   Not on file  Social History Narrative   Live alone.  Retired Research officer, political party.  Children grown-3, one local.  Caffeine: non caffeine.  Education:  HS.      Family History  Problem Relation Age of Onset   Heart attack Mother    Lung cancer Father    Cancer Sister     Past Medical History:  Diagnosis Date   CVA (cerebral vascular accident) (HCC)  Dysrhythmia    Hypertension    Medical history non-contributory     No past surgical history on file.  Current Outpatient Medications  Medication Sig Dispense Refill   aspirin EC 81 MG EC tablet Take 1 tablet (81 mg total) by mouth daily. 30 tablet 0   atorvastatin (LIPITOR) 40 MG tablet Take 1 tablet (40 mg total) by mouth daily at 6 PM. 30 tablet 0   clopidogrel (PLAVIX) 75 MG tablet Take 1 tablet (75 mg total) by mouth daily. 21 tablet 0   hydrochlorothiazide (MICROZIDE) 12.5 MG capsule Take 12.5 mg by mouth daily.     irbesartan (AVAPRO) 75 MG tablet Take 75 mg by mouth daily.     No current facility-administered medications for this visit.     Allergies as of 05/22/2018 - Review Complete 04/02/2018  Allergen Reaction Noted   Oxycodone Nausea And Vomiting and Other (See Comments) 01/07/2014    Vitals: There were no vitals taken for this visit. Last Weight:  Wt Readings from Last 1 Encounters:  04/02/18 197 lb 3.2 oz (89.4 kg)   MVH:QIONG is no height or weight on file to calculate BMI.     Last Height:   Ht Readings from Last 1 Encounters:  04/02/18 6' (1.829 m)    OBSERVATION:   General: The patient is awake, alert and appears not in acute distress. The  patient is well groomed. Head: Normocephalic, atraumatic. Neck is supple. Mallampati 4 neck circumference: 17" . Nasal airflow patent - ,. Retrognathia is seen.  Cardiovascular:   without distended neck veins. Respiratory: holding breath for 22 sec.  Skin:  Without evidence of edema, or rash Trunk: BMI is. The patient's posture is erect.    Neurologic exam : The patient is awake and alert, oriented to place and time.   Memory subjective described as intact.   Attention span & concentration ability appears normal.  Speech is without dysarthria, dysphonia but slowed , there may be some  aphasia.  Mood and affect are appropriate.  Cranial nerves: Pupils are equal . Extraocular movements  in vertical and horizontal planes intact  Facial motor strength is symmetric and tongue and uvula move midline. Shoulder shrug was symmetrical.   Motor exam:  symmetric strength in all extremities.  Coordination: alternating movements in the fingers/hands slower on the right- but patient is left hand dominant.  Finger-to-nose maneuver  normal without evidence of ataxia, dysmetria or tremor.  Gait and station: Patient walks without assistive device .   Assessment and Plan:  The patient has been unaware of any gasping, breath holding spells, but acknowledged that he snores, and has been falling asleep easily when not stimulated or physically active.  His sleep habits have changed with retirement, not so much with the stroke he suffered in January 2020.   I will offer a HST to screen him for apnea. The patient drives and is able to pick a device up at the door.    Follow Up Instructions: if apnea is found we will offer , discuss, and initiate treatment options, If not , he will not need to follow up with me, can implement the sleep boot camp for better sleep and follow with the stroke service NP.     I discussed the assessment and treatment plan with the patient. The patient was provided an opportunity  to ask questions and all were answered. The patient agreed with the plan and demonstrated an understanding of the instructions.   The patient was  advised to call back or seek an in-person evaluation if the symptoms worsen or if the condition fails to improve as anticipated.  I provided 28 minutes of non-face-to-face time during this encounter.   Melvyn Novas, MD 05/22/2018, 11:11 AM  Certified in Neurology by ABPN Certified in Sleep Medicine by [facility] [address], [address] [place], [place] [address]             PS : NP Norva Riffle Note of the patient's last visit :  HISTORY OF PRESENT ILLNESS:  From hospital record DORRIEN GRUNDER is a 68 y.o. male with a medical history of hypertension, who presented to the emergency department with complaints of right-sided weakness and numbness which started  02/14/2018.  He states he has been having intermittent episodes lasting approximately 25 minutes of right arm and leg weakness as well as discoordination with his right arm.  He has felt some numbness in his fingertips as well.  Patient also states he had some slurred speech this morning along with dizziness.  He denied any chest pain, shortness of breath, abdominal pain, nausea or vomiting, diarrhea or constipation, problems with dysuria, hematuria, headache, visual changes, neck pain, recent illness or travel.  He does admit to not taking his medications for blood pressure as directed. Patient was evaluated by Dr. Roda Shutters on 02/15/2018 PT , OT recommended.  MRI showed left thalamic infarct.No associated hemorrhage or mass-effect. -CT head normal -Chest x-ray and UA reviewed, unremarkable for infection. -CTA head and neck showed patent carotid and vertebral arteries.  No dissection, aneurysm, hemodynamically significant stenosis.  Patent anterior and posterior intracranial circulation.  No large vessel occlusion, aneurysm, significant stenosis. -Hemoglobin A1c 5.8,   LDL 150 -Echocardiogram showed an EF of 60 to 65% -EEG normal negative for seizure No antithrombotic prior to admission -recommended aspirin 81 mg daily and clopidogrel 75 mg daily.  Continue DAPT for 3 weeks and aspirin alone.  Follow-up with neurology in 4 weeks. -Continue aspirin, statin, plavix UPDATE 3/11/2020CM Mr. Talent, 68 year old male returns for follow-up with history of left thalamic stroke in January 2020.  He also has risk factors of hypertension hyperlipidemia mild obesity and daytime somnolence.  He has never had a sleep study.  Initially he had some right arm and leg weakness.  He has received physical therapy and occupational therapy and that has concluded.  He claims he is going to get some speech therapy for cognitive issues.  He complains of daytime drowsiness.  He gets little exercise since his retirement last year.  Blood pressure in the office today 130/71.  He is taking his lisinopril as directed.  He remains on aspirin for secondary stroke prevention without recurrent stroke or TIA symptoms.  He is on Lipitor without myalgias.  ESS score 10. FSS 56.  He returns for reevaluation  REVIEW OF SYSTEMS: Full 14 system review of systems performed and notable only for those listed, all others are neg:  Constitutional: neg  Cardiovascular: neg Ear/Nose/Throat: neg  Skin: neg Eyes: neg Respiratory: neg Gastroitestinal: neg  Hematology/Lymphatic: neg  Endocrine: neg Musculoskeletal:neg Allergy/Immunology: neg Neurological:  occasional numbness in the right hand  Psychiatric: neg Sleep : Daytime drowsiness   PHYSICAL EXAM  There were no vitals filed for this visit. There is no height or weight on file to calculate BMI.  Generalized: Well developed, in no acute distress  Head: normocephalic and atraumatic,. Oropharynx benign  Neck: Supple, no carotid bruits  Cardiac: Regular rate rhythm, no murmur  Musculoskeletal: No deformity   Neurological examination    Mentation: Alert oriented to time, place, history taking. Attention span and concentration appropriate. Recent and remote memory intact.  Follows all commands speech and language fluent.   Cranial nerve II-XII: Pupils were equal round reactive to light extraocular movements were full, visual field were full on confrontational test. Facial sensation and strength were normal. hearing was intact to finger rubbing bilaterally. Uvula tongue midline. head turning and shoulder shrug were normal and symmetric.Tongue protrusion into cheek strength was normal. Motor: normal bulk and tone, full strength in the BUE, BLE, fine finger movements normal, no pronator drift. No focal weakness Sensory: normal and symmetric to light touch, pinprick, and  Vibration, in the upper and lower extremities Coordination: finger-nose-finger, heel-to-shin bilaterally, no dysmetria Reflexes: Symmetric upper and lower, plantar responses were flexor bilaterally. Gait and Station: Rising up from seated position without assistance, normal stance,  moderate stride, good arm swing, smooth turning, able to perform tiptoe, and heel walking without difficulty. Tandem gait is steady, no assistive device DIAGNOSTIC DATA (LABS, IMAGING, TESTING) - I reviewed patient records, labs, notes, testing and imaging myself where available.  Lab Results  Component Value Date   WBC 5.3 02/14/2018   HGB 16.3 02/14/2018   HCT 49.0 02/14/2018   MCV 91.4 02/14/2018   PLT 184 02/14/2018      Component Value Date/Time   NA 139 02/14/2018 1443   K 3.6 02/14/2018 1443   CL 106 02/14/2018 1443   CO2 23 02/14/2018 1443   GLUCOSE 103 (H) 02/14/2018 1443   BUN 12 02/14/2018 1443   CREATININE 0.93 02/14/2018 1443   CALCIUM 9.2 02/14/2018 1443   PROT 7.6 02/14/2018 1443   ALBUMIN 3.8 02/14/2018 1443   AST 18 02/14/2018 1443   ALT 23 02/14/2018 1443   ALKPHOS 56 02/14/2018 1443   BILITOT 0.9 02/14/2018 1443   GFRNONAA >60 02/14/2018 1443    GFRAA >60 02/14/2018 1443   Lab Results  Component Value Date   CHOL 214 (H) 02/15/2018   HDL 34 (L) 02/15/2018   LDLCALC 150 (H) 02/15/2018   TRIG 148 02/15/2018   CHOLHDL 6.3 02/15/2018   Lab Results  Component Value Date   HGBA1C 5.8 (H) 02/15/2018     ASSESSMENT AND PLAN Mr. Shea EvansWilliam B Laflam is a 68 y.o. male with history of hypertension and hyperlipidemia presenting with right-sided arm and leg numbness, wax and wane with dizziness/lightheadedness. He did not receive IV t-PA due to mild symptoms.  The patient was seen bu Dr. Roda ShuttersXu in the hospital to follow-up in the stroke clinic This note is sent to the work in doctor.      PLAN: Stressed the importance of management of risk factors to prevent further stroke Continue aspirin for secondary stroke prevention Maintain strict control of hypertension with blood pressure goal below 130/90, today's reading 130/71 continue antihypertensive medications Control of diabetes with hemoglobin A1c below 6.5 followed by primary care most recent hemoglobin A1c 5.8 Cholesterol with LDL cholesterol less than 70, followed by primary care,  most recent 150,  continue statin drug Lipitor Exercise by walking, slowly increase , eat healthy diet with whole grains,  fresh fruits and vegetables Follow-up with primary care for stroke risk factor modification, maintain blood pressure goal less than 130 systolic, diabetes with A1c below 7, lipids with LDL below 70 F/U in 6 months Will obtain sleep study ESS score 10. FSS 56  .Discussed risk for recurrent stroke/ TIA and answered  additional questions, and given written printout.  Also discussed risk of untreated sleep apnea. This was a prolonged visit requiring 35 minutes and medical decision making of high complexity with extensive review of history, hospital chart, counseling and answering questions   [facility] [address], [address] [place], [place] [address] [phone]

## 2018-06-18 ENCOUNTER — Other Ambulatory Visit: Payer: Self-pay

## 2018-06-18 ENCOUNTER — Ambulatory Visit (INDEPENDENT_AMBULATORY_CARE_PROVIDER_SITE_OTHER): Payer: No Typology Code available for payment source | Admitting: Neurology

## 2018-06-18 DIAGNOSIS — R0683 Snoring: Secondary | ICD-10-CM

## 2018-06-18 DIAGNOSIS — G459 Transient cerebral ischemic attack, unspecified: Secondary | ICD-10-CM

## 2018-06-18 DIAGNOSIS — G4733 Obstructive sleep apnea (adult) (pediatric): Secondary | ICD-10-CM

## 2018-06-18 DIAGNOSIS — I6381 Other cerebral infarction due to occlusion or stenosis of small artery: Secondary | ICD-10-CM

## 2018-06-18 DIAGNOSIS — I63212 Cerebral infarction due to unspecified occlusion or stenosis of left vertebral arteries: Secondary | ICD-10-CM

## 2018-06-18 DIAGNOSIS — R400 Somnolence: Secondary | ICD-10-CM

## 2018-06-18 DIAGNOSIS — I10 Essential (primary) hypertension: Secondary | ICD-10-CM

## 2018-07-01 NOTE — Procedures (Signed)
First Name: Ethan CossWilliam Last Name: Merril Reed ID: [id number]  Birth Date: Aug 12, 1950 Age: 68 Gender: Male  Referring Provider: Mila PalmerWolters, Sharon, MD BMI: 26.6 (W=196 lb, H=6' 0'')  Neck Circ.:  17 '' Epworth:  10/24   Sleep Study Information    Study Date: Jun 19, 2018 S/H/A Version: 5.1.76.4 / 4.1.1528 / 76  History:   The patient has HTN, hyperlipidemia and is overweight, Controlled on 2 meds. GERD.  Has a cardiac arrhythmia - unspecified. He carried the diagnoses of hepatic fibrosis related to hepatitis C. There is an entry in his EPIC chart about hepatic coma ( ?).  The patient suffered a stroke in January 2020, acute left thalamic stroke. Noted he was clumsy when he started to get dressed the morning of the stroke, not sure he woke up with the symptom. He has finished OT, PT and did not need ST he reported.  He speaks slowly, is easily distracted during this sleep interview. He reported being left handed and therefore less impaired by the thalamic stroke affecting the right hand , shoulder and face, numbness on the right lip, right tongue and clumsiness initially when typing , writing, his gait had changed. Here to be evaluated for sleep apnea as possible risk factor. He reported hypersomnolence.       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 Overall AHI at 19.1 /h classified as mild -moderate, during REM sleep the apnea AHI increased to 27.3/h. supine sleep also worsened the AHI.  No sleep apnea related hypoxia was noted and the hypnogram showed multiple REM sleep phases.     Recommendations:       With REM sleep accentuation of Apnea it would be preferable to use PAP therapy. I will prescribe auto CPAP 5-15 cm water, 2 cm EPR, heated humidity and mask of choice, and follow up in 3 month with NP or me.     Electronically Signed: Melvyn Novasarmen Sherry Blackard, MD   07-01-2018           Sleep Summary  Oxygen Saturation Statistics   Start Study Time: End Study Time: Total  Recording Time:  12:18:22 AM        8:11:01 AM  7 h, 52 min  Total Sleep Time % REM of Sleep Time:  7 h, 25 min  30.8    Mean: 93 Minimum: 87 Maximum: 99  Mean of Desaturations Nadirs (%):   91  Oxygen Desaturation %:   4-9 10-20 >20 Total  Events Number Total    89  1 98.9 1.1  0 0.0  90 100.0  Oxygen Saturation: <90 <=88 <85 <80 <70  Duration (minutes): Sleep % 3.6 0.8  1.2 0.0  0.3 0.0 0.0 0.0 0.0 0.0     Respiratory Indices      Total Events REM NREM All Night  pRDI:  160  pAHI:  141 ODI:  90  pAHIc:  38  % CSR: 12.3 30.4 27.3 18.5 6.6 17.8 15.5 9.4 5.2 21.7 19.1 12.2 5.6       Pulse Rate Statistics during Sleep (BPM)      Mean: 65 Minimum: N/A Maximum: 121    Indices are calculated using technically valid sleep time of  7 hrs, 22 min. Central-Indices are calculated using technically valid sleep time of  6  hrs, 45 min. pRDI/pAHI are calculated using oxi desaturations ? 3%  Body Position Statistics  Position Supine Prone Right Left Non-Supine  Sleep (min) 305.2 1.0 0.0  139.0 140.0  Sleep % 68.5 0.2 0.0 31.2 31.5  pRDI 26.5 N/A N/A 11.3 11.2  pAHI 24.4 N/A N/A 7.8 7.7  ODI 16.0 N/A N/A 3.9 3.9     Snoring Statistics Snoring Level (dB) >40 >50 >60 >70 >80 >Threshold (45)  Sleep (min) 51.3 4.2 1.4 0.1 0.0 6.2  Sleep % 11.5 0.9 0.3 0.0 0.0 1.4    Mean: 40 dB Sleep Stages Chart                             pAHI=19.1                                         Mild              Moderate                    Severe                                                 5              15                    30

## 2018-07-01 NOTE — Addendum Note (Signed)
Addended by: Larey Seat on: 07/01/2018 05:15 PM   Modules accepted: Orders

## 2018-07-02 ENCOUNTER — Encounter: Payer: Self-pay | Admitting: Neurology

## 2018-07-02 ENCOUNTER — Telehealth: Payer: Self-pay | Admitting: Neurology

## 2018-07-02 NOTE — Telephone Encounter (Signed)
-----   Message from Larey Seat, MD sent at 07/01/2018  5:15 PM EDT ----- Referred by Cecille Rubin, [place]. Stroke service.    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 Overall AHI at 19.1 /h  classified as mild -moderate, during REM sleep the apnea AHI  increased to 27.3/h. supine sleep also worsened the AHI.  No sleep apnea related hypoxia was noted and the hypnogram showed  multiple REM sleep phases.     Recommendations:   With REM sleep accentuation of Apnea it would be preferable to  use PAP therapy. I will prescribe auto CPAP 5-15 cm water, 2 cm EPR, heated  humidity and mask of choice, and follow up in 3 month with NP or  me.   Electronically Signed: Larey Seat, MD  07-01-2018

## 2018-07-02 NOTE — Telephone Encounter (Signed)
I called Ethan Reed. I advised Ethan Reed that Dr. Brett Fairy reviewed their sleep study results and found that Ethan Reed mild to moderate sleep apnea. Dr. Brett Fairy recommends that Ethan Reed starts auto CPAP. I reviewed PAP compliance expectations with the Ethan Reed. Ethan Reed is agreeable to starting a CPAP. I advised Ethan Reed that an order will be sent to a DME, [facility], and [facility] will call the Ethan Reed within about one week after they file with the Ethan Reed's insurance. [facility] will show the Ethan Reed how to use the machine, fit for masks, and troubleshoot the CPAP if needed. A follow up appt was made for insurance purposes with Ward Givens  on Aug 10,2020 at 8:30 am. Ethan Reed verbalized understanding to arrive 15 minutes early and bring their CPAP. A letter with all of this information in it will be mailed to the Ethan Reed as a reminder. I verified with the Ethan Reed that the address we have on file is correct. Ethan Reed verbalized understanding of results. Ethan Reed had no questions at this time but was encouraged to call back if questions arise. I have sent the order to [facility] and have received confirmation that they have received the order.

## 2018-09-01 ENCOUNTER — Telehealth: Payer: Self-pay

## 2018-09-01 ENCOUNTER — Encounter: Payer: Self-pay | Admitting: Adult Health

## 2018-09-01 NOTE — Progress Notes (Signed)
This encounter was created in error - please disregard.

## 2018-09-01 NOTE — Telephone Encounter (Signed)
Patient was a no call/no show for their appointment today.   

## 2018-09-04 ENCOUNTER — Ambulatory Visit (INDEPENDENT_AMBULATORY_CARE_PROVIDER_SITE_OTHER): Payer: No Typology Code available for payment source | Admitting: Adult Health

## 2018-09-04 ENCOUNTER — Other Ambulatory Visit: Payer: Self-pay

## 2018-09-04 ENCOUNTER — Encounter: Payer: Self-pay | Admitting: Adult Health

## 2018-09-04 VITALS — BP 125/82 | HR 72 | Temp 97.7°F | Ht 72.0 in | Wt 172.2 lb

## 2018-09-04 DIAGNOSIS — Z9989 Dependence on other enabling machines and devices: Secondary | ICD-10-CM

## 2018-09-04 DIAGNOSIS — G4733 Obstructive sleep apnea (adult) (pediatric): Secondary | ICD-10-CM

## 2018-09-04 NOTE — Patient Instructions (Addendum)
Continue using CPAP nightly and greater than 4 hours each night If your symptoms worsen or you develop new symptoms please let us know.   Please call [facility] at [phone], and press option 1 when prompted. Their customer service representatives will be glad to assist you. If they are unable to answer, please leave a message and they will call you back. Make sure to leave your name and return phone number.  

## 2018-09-04 NOTE — Progress Notes (Signed)
PATIENT: Ethan Reed DOB: 04-14-1950  REASON FOR VISIT: follow up HISTORY FROM: patient  HISTORY OF PRESENT ILLNESS: Today 09/04/18:  Mr. Ethan Reed is a 68 year old male with a history of obstructive sleep apnea.  He returns today for follow-up.  His CPAP download indicates that he uses machine 24 out of 30 days for compliance of 80%.  He uses machine greater than 4 hours 17 days for compliance of 57%.  On average he uses his machine 5 hours and 37 minutes.  His residual AHI is 2.8 on 5 to 15 cm of water with EPR of 2.  His leak in the 95th percentile is 4.5.  He states that he started to see the benefit of using the CPAP but has not noticed a big change yet.  Denies any new issues.  Returns today for evaluation.  HISTORY (Copied from Dr.Dohmeier's note) 05/22/2018 Ethan Reed is a 68 y.o.  African-American male stroke patient , seen here  in a referral from Cecille Rubin, NP, for a sleep study evaluation-   Sleep and medical history:   The patient has HTN,  Controlled on 2 meds. GERD.  Has a cardiac arrhythmia - unspecified. 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 There is an entry  in his EPIC chart about hepatic coma ( ?).   The patient suffered a stroke in Januay 2020, a left thalamic stroke. Noted he was clumsy when he started to get dressed the morning of the stroke, not sure he woke up with the symptom He has finished OT, PT and did not need ST he reported.  He speaks slowly, is easily distracted during this sleep interview. He reported being left handed and therfore less impaired by the thalamic stroke affecting the right hand , shoulder and face, numbness on the right lip, right tongue and clumsiness initially when typing , writing, his gait had changed.   Quote: Pt arrived and SLP initiated cognitive-linguistic testing after pt interview; per OT notes pt with mild deficits in alternating anddivided attention, working memory, mental manipulation, and  minor fluency in language. Shortly after testing began, as SLP provided feedback re: cognitive tasks, pt stated, "We're done here," and left the treatment room. SLP confirmed pt did not want to complete testing. Visit arrived, no charge for today's session. Pt does not wish to pursue speech therapy at this time."  Patient has a new complaint of daytime somnolence.  He has never had a sleep study.   MRI of the brain left thalmic stroke.  CT head normal -Chest x-ray and UA reviewed, unremarkable for infection. -CTA head and neck showed patent carotid and vertebral arteries. No dissection, aneurysm, hemodynamically significant stenosis. Patent anterior and posterior intracranial circulation. No large vessel occlusion, aneurysm, significant stenosis. -Hemoglobin A1c 5.8,  LDL 150 -Echocardiogram showed an EF of 60 to 65% -EEG normal negative for seizure    REVIEW OF SYSTEMS: Out of a complete 14 system review of symptoms, the patient complains only of the following symptoms, and all other reviewed systems are negative.  ALLERGIES: Allergies  Allergen Reactions  . Oxycodone Nausea And Vomiting and Other (See Comments)    Dizziness    HOME MEDICATIONS: Outpatient Medications Prior to Visit  Medication Sig Dispense Refill  . aspirin EC 81 MG EC tablet Take 1 tablet (81 mg total) by mouth daily. 30 tablet 0  . atorvastatin (LIPITOR) 40 MG tablet Take 1 tablet (40 mg total) by mouth daily at 6 PM.  30 tablet 0  . clopidogrel (PLAVIX) 75 MG tablet Take 1 tablet (75 mg total) by mouth daily. 21 tablet 0  . hydrochlorothiazide (MICROZIDE) 12.5 MG capsule Take 12.5 mg by mouth daily.    . irbesartan (AVAPRO) 75 MG tablet Take 75 mg by mouth daily.     No facility-administered medications prior to visit.     PAST MEDICAL HISTORY: Past Medical History:  Diagnosis Date  . CVA (cerebral vascular accident) (HCC)   . Dysrhythmia   . Hypertension   . Medical history non-contributory      PAST SURGICAL HISTORY: History reviewed. No pertinent surgical history.  FAMILY HISTORY: Family History  Problem Relation Age of Onset  . Heart attack Mother   . Lung cancer Father   . Cancer Sister     SOCIAL HISTORY: Social History   Socioeconomic History  . Marital status: Widowed    Spouse name: Not on file  . Number of children: Not on file  . Years of education: Not on file  . Highest education level: Not on file  Occupational History  . Not on file  Social Needs  . Financial resource strain: Not on file  . Food insecurity    Worry: Patient refused    Inability: Patient refused  . Transportation needs    Medical: Patient refused    Non-medical: Patient refused  Tobacco Use  . Smoking status: Never Smoker  . Smokeless tobacco: Never Used  Substance and Sexual Activity  . Alcohol use: No  . Drug use: No  . Sexual activity: Yes  Lifestyle  . Physical activity    Days per week: Not on file    Minutes per session: Not on file  . Stress: Not on file  Relationships  . Social Musicianconnections    Talks on phone: Not on file    Gets together: Not on file    Attends religious service: Not on file    Active member of club or organization: Not on file    Attends meetings of clubs or organizations: Not on file    Relationship status: Not on file  . Intimate partner violence    Fear of current or ex partner: Patient refused    Emotionally abused: Patient refused    Physically abused: Patient refused    Forced sexual activity: Patient refused  Other Topics Concern  . Not on file  Social History Narrative   Live alone.  Retired Research officer, political partypostal service.  Children grown-3, one local.  Caffeine: non caffeine.  Education:  HS.        PHYSICAL EXAM  Vitals:   09/04/18 0819  BP: 125/82  Pulse: 72  Temp: 97.7 F (36.5 C)  TempSrc: Oral  Weight: 172 lb 3.2 oz (78.1 kg)  Height: 6' (1.829 m)   Body mass index is 23.35 kg/m.  Generalized: Well developed, in no acute  distress  Chest: Lungs clear to auscultation bilaterally  Neurological examination  Mentation: Alert oriented to time, place, history taking. Follows all commands speech and language fluent Cranial nerve II-XII:  Extraocular movements were full, visual field were full on confrontational test.  Head turning and shoulder shrug  were normal and symmetric. Motor: The motor testing reveals 5 over 5 strength of all 4 extremities. Good symmetric motor tone is noted throughout.  Sensory: Sensory testing is intact to soft touch on all 4 extremities. No evidence of extinction is noted.  Coordination: Cerebellar testing reveals good finger-nose-finger and heel-to-shin bilaterally.  Gait and  station: Gait is normal.     DIAGNOSTIC DATA (LABS, IMAGING, TESTING) - I reviewed patient records, labs, notes, testing and imaging myself where available.  Lab Results  Component Value Date   WBC 5.3 02/14/2018   HGB 16.3 02/14/2018   HCT 49.0 02/14/2018   MCV 91.4 02/14/2018   PLT 184 02/14/2018      Component Value Date/Time   NA 139 02/14/2018 1443   K 3.6 02/14/2018 1443   CL 106 02/14/2018 1443   CO2 23 02/14/2018 1443   GLUCOSE 103 (H) 02/14/2018 1443   BUN 12 02/14/2018 1443   CREATININE 0.93 02/14/2018 1443   CALCIUM 9.2 02/14/2018 1443   PROT 7.6 02/14/2018 1443   ALBUMIN 3.8 02/14/2018 1443   AST 18 02/14/2018 1443   ALT 23 02/14/2018 1443   ALKPHOS 56 02/14/2018 1443   BILITOT 0.9 02/14/2018 1443   GFRNONAA >60 02/14/2018 1443   GFRAA >60 02/14/2018 1443   Lab Results  Component Value Date   CHOL 214 (H) 02/15/2018   HDL 34 (L) 02/15/2018   LDLCALC 150 (H) 02/15/2018   TRIG 148 02/15/2018   CHOLHDL 6.3 02/15/2018   Lab Results  Component Value Date   HGBA1C 5.8 (H) 02/15/2018      ASSESSMENT AND PLAN 68 y.o. year old male  has a past medical history of CVA (cerebral vascular accident) (HCC), Dysrhythmia, Hypertension, and Medical history non-contributory. here with :   1.  Obstructive sleep apnea on CPAP  The patient's CPAP download shows suboptimal  compliance and good treatment of his apnea.  He is encouraged to continue using CPAP nightly and greater than 4 hours each night.  He is advised that if his symptoms worsen or he develops new symptoms he should let us know.  He will follow-up in 6 months or sooner if needed    I spent 15 minutes with the patient. 50% of this time was spent reviewing CPAP download   Butch PennyMegan Kimball Appleby, MSN, NP-C 09/04/2018, 8:32 AM [facility] [address], [address] [place], [place] [address] [phone]

## 2018-10-03 ENCOUNTER — Ambulatory Visit: Payer: Medicare Other | Admitting: Adult Health

## 2018-10-06 ENCOUNTER — Telehealth: Payer: Self-pay

## 2018-10-06 DIAGNOSIS — G4733 Obstructive sleep apnea (adult) (pediatric): Secondary | ICD-10-CM

## 2018-10-06 NOTE — Telephone Encounter (Signed)
Placed download in in box to view.

## 2018-10-06 NOTE — Telephone Encounter (Signed)
Pt called and stated he stop wearing the cipap on 10/03/2018. He stated since wearing the cipap he feels like its causing air to be in his stomach.He has diminish appetite, feeling bloated,and also it feels like gas in his stomach. I stated message will be sent to [facility] NP nurse. His contact number is [phone].

## 2018-10-07 NOTE — Addendum Note (Signed)
Addended by: Trudie Buckler on: 10/07/2018 04:23 PM   Modules accepted: Orders

## 2018-10-07 NOTE — Telephone Encounter (Signed)
Patient CPAP download looks excellent.  His pressure is currently 5-15 with a residual AHI is 1.8.  No significant leak.  I will decrease his pressure to 5 to 11 cm of water to see if this is helpful for the bloating sensation.

## 2018-10-07 NOTE — Telephone Encounter (Signed)
Order sent to [facility] for cpap pressure change.

## 2018-10-08 NOTE — Telephone Encounter (Signed)
[facility] received orders for cpap.

## 2018-10-29 ENCOUNTER — Ambulatory Visit: Payer: Medicare Other | Admitting: Adult Health

## 2018-11-03 NOTE — Progress Notes (Deleted)
[facility]  PATIENT: Ethan Reed DOB: 05/08/1950   REASON FOR VISIT: Follow-up for acute CVA with risk factors of hypertension hyperlipidemia mild obesity and daytime somnolence HISTORY FROM: Patient    HISTORY OF PRESENT ILLNESS: From hospital record Ethan Reed is a 68 y.o. male with a medical history of hypertension, who presented to the emergency department with complaints of right-sided weakness and numbness which started  02/14/2018.  He states he has been having intermittent episodes lasting approximately 25 minutes of right arm and leg weakness as well as discoordination with his right arm.  He has felt some numbness in his fingertips as well.  Patient also states he had some slurred speech this morning along with dizziness.  He denied any chest pain, shortness of breath, abdominal pain, nausea or vomiting, diarrhea or constipation, problems with dysuria, hematuria, headache, visual changes, neck pain, recent illness or travel.  He does admit to not taking his medications for blood pressure as directed. Patient was evaluated by Dr. Roda ShuttersXu on 02/15/2018 PT , OT recommended.  MRI showed left thalamic infarct.No associated hemorrhage or mass-effect. -CT head normal -Chest x-ray and UA reviewed, unremarkable for infection. -CTA head and neck showed patent carotid and vertebral arteries.  No dissection, aneurysm, hemodynamically significant stenosis.  Patent anterior and posterior intracranial circulation.  No large vessel occlusion, aneurysm, significant stenosis. -Hemoglobin A1c 5.8,  LDL 150 -Echocardiogram showed an EF of 60 to 65% -EEG normal negative for seizure No antithrombotic prior to admission -recommended aspirin 81 mg daily and clopidogrel 75 mg daily.  Continue DAPT for 3 weeks and aspirin alone.  Follow-up with neurology in 4 weeks. 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  He also has risk factors of hypertension hyperlipidemia mild obesity and daytime somnolence.  He has never had a sleep study.  Initially he had some right arm and leg weakness.  He has received physical therapy and occupational therapy and that has concluded.  He claims he is going to get some speech therapy for cognitive issues.  He complains of daytime drowsiness.  He gets little exercise since his retirement last year.  Blood pressure in the office today 130/71.  He is taking his lisinopril as directed.  He remains on aspirin for secondary stroke prevention without recurrent stroke or TIA symptoms.  He is on Lipitor without myalgias.  ESS score 10. FSS 56.  He returns for reevaluation  Update 11/04/2018: Ethan Reed is being seen today for stroke follow-up regarding left thalamic stroke in 01/2018.  He continues on aspirin and atorvastatin for secondary stroke prevention without side effects.  Blood pressure today ***.  He continues to be followed by Dr. Vickey Hugerohmeier and Everlene OtherMegan Milliken, NP for OSA on CPAP management.    REVIEW OF SYSTEMS: Full 14 system review of systems performed and notable only for those listed, all others are neg:  Constitutional: neg  Cardiovascular: neg Ear/Nose/Throat: neg  Skin: neg Eyes: neg Respiratory: neg Gastroitestinal: neg  Hematology/Lymphatic: neg  Endocrine: neg Musculoskeletal:neg Allergy/Immunology: neg Neurological:  occasional numbness in the right hand  Psychiatric: neg Sleep : Daytime drowsiness   ALLERGIES: Allergies  Allergen Reactions  . Oxycodone Nausea And Vomiting and Other (See Comments)    Dizziness    HOME MEDICATIONS: Outpatient Medications Prior to Visit  Medication Sig Dispense Refill  . aspirin EC 81 MG EC tablet Take 1 tablet (81  mg total) by mouth daily. 30 tablet 0  . atorvastatin (LIPITOR) 40 MG tablet Take 1 tablet (40 mg total) by mouth daily at 6 PM. 30 tablet 0  .  clopidogrel (PLAVIX) 75 MG tablet Take 1 tablet (75 mg total) by mouth daily. 21 tablet 0  . hydrochlorothiazide (MICROZIDE) 12.5 MG capsule Take 12.5 mg by mouth daily.    . irbesartan (AVAPRO) 75 MG tablet Take 75 mg by mouth daily.     No facility-administered medications prior to visit.     PAST MEDICAL HISTORY: Past Medical History:  Diagnosis Date  . CVA (cerebral vascular accident) ([place])   . Dysrhythmia   . Hypertension   . Medical history non-contributory     PAST SURGICAL HISTORY: No past surgical history on file.  FAMILY HISTORY: Family History  Problem Relation Age of Onset  . Heart attack Mother   . Lung cancer Father   . Cancer Sister     SOCIAL HISTORY: Social History   Socioeconomic History  . Marital status: Widowed    Spouse name: Not on file  . Number of children: Not on file  . Years of education: Not on file  . Highest education level: Not on file  Occupational History  . Not on file  Social Needs  . Financial resource strain: Not on file  . Food insecurity    Worry: Patient refused    Inability: Patient refused  . Transportation needs    Medical: Patient refused    Non-medical: Patient refused  Tobacco Use  . Smoking status: Never Smoker  . Smokeless tobacco: Never Used  Substance and Sexual Activity  . Alcohol use: No  . Drug use: No  . Sexual activity: Yes  Lifestyle  . Physical activity    Days per week: Not on file    Minutes per session: Not on file  . Stress: Not on file  Relationships  . Social Herbalist on phone: Not on file    Gets together: Not on file    Attends religious service: Not on file    Active member of club or organization: Not on file    Attends meetings of clubs or organizations: Not on file    Relationship status: Not on file  . Intimate partner violence    Fear of current or ex partner: Patient refused    Emotionally abused: Patient refused    Physically abused: Patient refused    Forced  sexual activity: Patient refused  Other Topics Concern  . Not on file  Social History Narrative   Live alone.  Retired Actor.  Children grown-3, one local.  Caffeine: non caffeine.  Education:  HS.       PHYSICAL EXAM  There were no vitals filed for this visit. There is no height or weight on file to calculate BMI.  Generalized: Well developed, in no acute distress  Head: normocephalic and atraumatic,. Oropharynx benign  Neck: Supple, no carotid bruits  Cardiac: Regular rate rhythm, no murmur  Musculoskeletal: No deformity   Neurological examination   Mentation: Alert oriented to time, place, history taking. Attention span and concentration appropriate. Recent and remote memory intact.  Follows all commands speech and language fluent.   Cranial nerve II-XII: Pupils were equal round reactive to light extraocular movements were full, visual field were full on confrontational test. Facial sensation and strength were normal. hearing was intact to finger rubbing bilaterally. Uvula tongue midline. head turning and shoulder shrug  were normal and symmetric.Tongue protrusion into cheek strength was normal. Motor: normal bulk and tone, full strength in the BUE, BLE, fine finger movements normal, no pronator drift. No focal weakness Sensory: normal and symmetric to light touch, pinprick, and  Vibration, in the upper and lower extremities Coordination: finger-nose-finger, heel-to-shin bilaterally, no dysmetria Reflexes: Symmetric upper and lower, plantar responses were flexor bilaterally. Gait and Station: Rising up from seated position without assistance, normal stance,  moderate stride, good arm swing, smooth turning, able to perform tiptoe, and heel walking without difficulty. Tandem gait is steady, no assistive device DIAGNOSTIC DATA (LABS, IMAGING, TESTING) - I reviewed patient records, labs, notes, testing and imaging myself where available.  Lab Results  Component Value Date    WBC 5.3 02/14/2018   HGB 16.3 02/14/2018   HCT 49.0 02/14/2018   MCV 91.4 02/14/2018   PLT 184 02/14/2018      Component Value Date/Time   NA 139 02/14/2018 1443   K 3.6 02/14/2018 1443   CL 106 02/14/2018 1443   CO2 23 02/14/2018 1443   GLUCOSE 103 (H) 02/14/2018 1443   BUN 12 02/14/2018 1443   CREATININE 0.93 02/14/2018 1443   CALCIUM 9.2 02/14/2018 1443   PROT 7.6 02/14/2018 1443   ALBUMIN 3.8 02/14/2018 1443   AST 18 02/14/2018 1443   ALT 23 02/14/2018 1443   ALKPHOS 56 02/14/2018 1443   BILITOT 0.9 02/14/2018 1443   GFRNONAA >60 02/14/2018 1443   GFRAA >60 02/14/2018 1443   Lab Results  Component Value Date   CHOL 214 (H) 02/15/2018   HDL 34 (L) 02/15/2018   LDLCALC 150 (H) 02/15/2018   TRIG 148 02/15/2018   CHOLHDL 6.3 02/15/2018   Lab Results  Component Value Date   HGBA1C 5.8 (H) 02/15/2018     ASSESSMENT AND PLAN Ethan Reed is a 68 y.o. male with history of hypertension and hyperlipidemia presenting with right-sided arm and leg numbness, wax and wane with dizziness/lightheadedness. He did not receive IV t-PA due to mild symptoms.  Patient has a new complaint of daytime somnolence.  He has never had a sleep study.   MRI of the brain left thalmic stroke.  CT head normal -Chest x-ray and UA reviewed, unremarkable for infection. -CTA head and neck showed patent carotid and vertebral arteries.  No dissection, aneurysm, hemodynamically significant stenosis.  Patent anterior and posterior intracranial circulation.  No large vessel occlusion, aneurysm, significant stenosis. -Hemoglobin A1c 5.8,  LDL 150 -Echocardiogram showed an EF of 60 to 65% -EEG normal negative for seizure The patient was seen bu Dr. Roda Shutters in the hospital to follow-up in the stroke clinic This note is sent to the work in doctor.      PLAN: Stressed the importance of management of risk factors to prevent further stroke Continue aspirin for secondary stroke prevention Maintain strict  control of hypertension with blood pressure goal below 130/90, today's reading 130/71 continue antihypertensive medications Control of diabetes with hemoglobin A1c below 6.5 followed by primary care most recent hemoglobin A1c 5.8 Cholesterol with LDL cholesterol less than 70, followed by primary care,  most recent 150,  continue statin drug Lipitor Exercise by walking, slowly increase , eat healthy diet with whole grains,  fresh fruits and vegetables Follow-up with primary care for stroke risk factor modification, maintain blood pressure goal less than 130 systolic, diabetes with A1c below 7, lipids with LDL below 70 F/U in 6 months Will obtain sleep study ESS score 10. FSS 56  .  Discussed risk for recurrent stroke/ TIA and answered additional questions, and given written printout.  Also discussed risk of untreated sleep apnea. This was a prolonged visit requiring 35 minutes and medical decision making of high complexity with extensive review of history, hospital chart, counseling and answering questions  Ihor Austin, [facility]  [facility] [address] [place], [place] [address]  Phone [phone] Fax [phone] Note: This document was prepared with digital dictation and possible smart phrase technology. Any transcriptional errors that result from this process are unintentional.

## 2018-11-04 ENCOUNTER — Ambulatory Visit: Payer: Medicare Other | Admitting: Adult Health

## 2018-11-17 ENCOUNTER — Telehealth (INDEPENDENT_AMBULATORY_CARE_PROVIDER_SITE_OTHER): Payer: Commercial Managed Care - PPO | Admitting: Adult Health

## 2018-11-17 ENCOUNTER — Encounter: Payer: Self-pay | Admitting: Adult Health

## 2018-11-17 ENCOUNTER — Other Ambulatory Visit: Payer: Self-pay

## 2018-11-17 VITALS — BP 140/83 | HR 81 | Temp 97.7°F | Ht 72.0 in | Wt 187.4 lb

## 2018-11-17 DIAGNOSIS — G4733 Obstructive sleep apnea (adult) (pediatric): Secondary | ICD-10-CM

## 2018-11-17 DIAGNOSIS — I6381 Other cerebral infarction due to occlusion or stenosis of small artery: Secondary | ICD-10-CM

## 2018-11-17 DIAGNOSIS — I10 Essential (primary) hypertension: Secondary | ICD-10-CM | POA: Diagnosis not present

## 2018-11-17 DIAGNOSIS — I639 Cerebral infarction, unspecified: Secondary | ICD-10-CM

## 2018-11-17 DIAGNOSIS — E785 Hyperlipidemia, unspecified: Secondary | ICD-10-CM

## 2018-11-17 DIAGNOSIS — Z9989 Dependence on other enabling machines and devices: Secondary | ICD-10-CM

## 2018-11-17 NOTE — Patient Instructions (Addendum)
Continue aspirin 81 mg daily  and Lipitor for secondary stroke prevention  Stop plavix at this time  Continue to follow up with PCP regarding cholesterol and blood pressure management   Continue to follow in this office with Dr. Brett Fairy and Jinny Blossom, NP for OSA and CPAP management  Continue to monitor blood pressure at home  Maintain strict control of hypertension with blood pressure goal below 130/90, diabetes with hemoglobin A1c goal below 6.5% and cholesterol with LDL cholesterol (bad cholesterol) goal below 70 mg/dL. I also advised the patient to eat a healthy diet with plenty of whole grains, cereals, fruits and vegetables, exercise regularly and maintain ideal body weight.        Thank you for coming to see [place] at [facility]. I hope we have been able to provide you high quality care today.  You may receive a patient satisfaction survey over the next few weeks. We would appreciate your feedback and comments so that we may continue to improve ourselves and the health of our patients.

## 2018-11-17 NOTE — Progress Notes (Signed)
[facility]  PATIENT: Ethan Reed DOB: Jun 22, 1950   REASON FOR VISIT: Stroke follow-up HISTORY FROM: Patient    HISTORY OF PRESENT ILLNESS: From hospital record Ethan Reed is a 68 y.o. male with a medical history of hypertension, who presented to the emergency department with complaints of right-sided weakness and numbness which started  02/14/2018.    Stroke work-up revealed left thalamic infarct likely secondary to small vessel disease.  LDL 150.  A1c 5.8.  2D echo unremarkable.  EEG negative.  Initiated DAPT for 3 weeks and aspirin alone.  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  He also has risk factors of hypertension hyperlipidemia mild obesity and daytime somnolence.  He has never had a sleep study.  Initially he had some right arm and leg weakness.  He has received physical therapy and occupational therapy and that has concluded.  He claims he is going to get some speech therapy for cognitive issues.  He complains of daytime drowsiness.  He gets little exercise since his retirement last year.  Blood pressure in the office today 130/71.  He is taking his lisinopril as directed.  He remains on aspirin for secondary stroke prevention without recurrent stroke or TIA symptoms.  He is on Lipitor without myalgias.  ESS score 10. FSS 56.  He returns for reevaluation  Update via virtual visit 11/17/2018: Ethan Reed is being seen today for stroke follow-up regarding left thalamic stroke in 01/2018.  He continues on aspirin and Plavix despite 3 rate recommendation but denies bleeding or bruising.  Continues on atorvastatin for secondary stroke prevention without side effects.  Blood pressure today 140/83.  Recent physical with PCP with patient reporting lab work satisfactory (unable to personally review).  He continues to be followed by Dr. Vickey Huger and Everlene Other, NP for OSA on  CPAP management.  Recent adjustments to pressure settings with improvement of bloating concerns.  No further concerns at this time.    REVIEW OF SYSTEMS: Full 14 system review of systems performed and notable only for those listed, all others are neg:  Constitutional: neg  Cardiovascular: neg Ear/Nose/Throat: neg  Skin: neg Eyes: neg Respiratory: neg Gastroitestinal: neg  Hematology/Lymphatic: neg  Endocrine: neg Musculoskeletal:neg Allergy/Immunology: neg Neurological:  neg Psychiatric: neg Sleep : neg   ALLERGIES: Allergies  Allergen Reactions   Oxycodone Nausea And Vomiting and Other (See Comments)    Dizziness    HOME MEDICATIONS: Outpatient Medications Prior to Visit  Medication Sig Dispense Refill   aspirin EC 81 MG EC tablet Take 1 tablet (81 mg total) by mouth daily. 30 tablet 0   atorvastatin (LIPITOR) 40 MG tablet Take 1 tablet (40 mg total) by mouth daily at 6 PM. 30 tablet 0   hydrochlorothiazide (MICROZIDE) 12.5 MG capsule Take 12.5 mg by mouth daily.     irbesartan (AVAPRO) 75 MG tablet Take 75 mg by mouth daily.     clopidogrel (PLAVIX) 75 MG tablet Take 1 tablet (75 mg total) by mouth daily. 21 tablet 0   No facility-administered medications prior to visit.     PAST MEDICAL HISTORY: Past Medical History:  Diagnosis Date   CVA (cerebral vascular accident) (HCC)    Dysrhythmia    Hypertension    Medical history non-contributory     PAST SURGICAL HISTORY: History reviewed. No pertinent surgical history.  FAMILY HISTORY: Family History  Problem Relation Age of Onset   Heart  attack Mother    Lung cancer Father    Cancer Sister     SOCIAL HISTORY: Social History   Socioeconomic History   Marital status: Widowed    Spouse name: Not on file   Number of children: Not on file   Years of education: Not on file   Highest education level: Not on file  Occupational History   Not on file  Social Needs   Financial resource  strain: Not on file   Food insecurity    Worry: Patient refused    Inability: Patient refused   Transportation needs    Medical: Patient refused    Non-medical: Patient refused  Tobacco Use   Smoking status: Never Smoker   Smokeless tobacco: Never Used  Substance and Sexual Activity   Alcohol use: No   Drug use: No   Sexual activity: Yes  Lifestyle   Physical activity    Days per week: Not on file    Minutes per session: Not on file   Stress: Not on file  Relationships   Social connections    Talks on phone: Not on file    Gets together: Not on file    Attends religious service: Not on file    Active member of club or organization: Not on file    Attends meetings of clubs or organizations: Not on file    Relationship status: Not on file   Intimate partner violence    Fear of current or ex partner: Patient refused    Emotionally abused: Patient refused    Physically abused: Patient refused    Forced sexual activity: Patient refused  Other Topics Concern   Not on file  Social History Narrative   Live alone.  Retired Actor.  Children grown-3, one local.  Caffeine: non caffeine.  Education:  HS.       PHYSICAL EXAM  Vitals:   11/17/18 1014  BP: 140/83  Pulse: 81  Temp: 97.7 F (36.5 C)  TempSrc: Oral  Weight: 187 lb 6.4 oz (85 kg)  Height: 6' (1.829 m)   Body mass index is 25.42 kg/m.  Generalized: Well developed, pleasant middle-age male, in no acute distress  Head: normocephalic and atraumatic,. Oropharynx benign  Neck: Supple, no carotid bruits  Cardiac: Regular rate rhythm, no murmur  Musculoskeletal: No deformity   Neurological examination   Mentation: Alert oriented to time, place, history taking. Attention span and concentration appropriate. Recent and remote memory intact.  Follows all commands speech and language fluent.   Cranial nerve II-XII: Pupils were equal round reactive to light extraocular movements were full, visual  field were full on confrontational test. Facial sensation and strength were normal. hearing was intact to finger rubbing bilaterally. Uvula tongue midline. head turning and shoulder shrug were normal and symmetric Motor: normal bulk and tone, full strength in the BUE, BLE, fine finger movements normal, no pronator drift. No focal weakness Sensory: normal and symmetric to light touch, pinprick, and  Vibration, in the upper and lower extremities Coordination: finger-nose-finger, heel-to-shin bilaterally, no dysmetria Reflexes: Symmetric upper and lower, plantar responses were flexor bilaterally. Gait and Station: Rising up from seated position without assistance, normal stance, normal gait without balance difficulties.   DIAGNOSTIC DATA (LABS, IMAGING, TESTING) - I reviewed patient records, labs, notes, testing and imaging myself where available.  Lab Results  Component Value Date   WBC 5.3 02/14/2018   HGB 16.3 02/14/2018   HCT 49.0 02/14/2018   MCV 91.4 02/14/2018  PLT 184 02/14/2018      Component Value Date/Time   NA 139 02/14/2018 1443   K 3.6 02/14/2018 1443   CL 106 02/14/2018 1443   CO2 23 02/14/2018 1443   GLUCOSE 103 (H) 02/14/2018 1443   BUN 12 02/14/2018 1443   CREATININE 0.93 02/14/2018 1443   CALCIUM 9.2 02/14/2018 1443   PROT 7.6 02/14/2018 1443   ALBUMIN 3.8 02/14/2018 1443   AST 18 02/14/2018 1443   ALT 23 02/14/2018 1443   ALKPHOS 56 02/14/2018 1443   BILITOT 0.9 02/14/2018 1443   GFRNONAA >60 02/14/2018 1443   GFRAA >60 02/14/2018 1443   Lab Results  Component Value Date   CHOL 214 (H) 02/15/2018   HDL 34 (L) 02/15/2018   LDLCALC 150 (H) 02/15/2018   TRIG 148 02/15/2018   CHOLHDL 6.3 02/15/2018   Lab Results  Component Value Date   HGBA1C 5.8 (H) 02/15/2018     ASSESSMENT AND PLAN Ethan Reed is a 68 y.o. male with history of hypertension and hyperlipidemia  continues to be followed regarding left thalamic infarct in 01/2018 without  residual deficit.  He has also recently been diagnosed with OSA and continues to be followed by Dr. Vickey Hugerohmeier for CPAP management.     PLAN:  1. Left thalamic stroke: Continue aspirin 81 mg daily  and Lipitor for secondary stroke prevention.  Advised to discontinue Plavix as prolonged DAPT not indicated.  Maintain strict control of hypertension with blood pressure goal below 130/90, diabetes with hemoglobin A1c goal below 6.5% and cholesterol with LDL cholesterol (bad cholesterol) goal below 70 mg/dL.  I also advised the patient to eat a healthy diet with plenty of whole grains, cereals, fruits and vegetables, exercise regularly with at least 30 minutes of continuous activity daily and maintain ideal body weight. 2. HTN: Advised to continue current treatment regimen.  Today's BP stable.  Advised to continue to monitor at home along with continued follow-up with PCP for management 3. HLD: Advised to continue current treatment regimen along with continued follow-up with PCP for future prescribing and monitoring of lipid panel 4. OSA: Ongoing compliance of CPAP and follow-up as scheduled  We will continue to be followed in this office for CPAP management therefore no indication to schedule follow-up visit regarding stroke  Greater than 50% of this 25-minute visit was spent discussing prior stroke, OSA on CPAP, importance of ongoing management of HTN and HLD, importance of CPAP compliance and answered all questions to patient satisfaction   Ihor AustinJessica McCue, AGNP-BC  [facility] [address] [place], [place] [id number]  Phone [phone] Fax [phone] Note: This document was prepared with digital dictation and possible smart phrase technology. Any transcriptional errors that result from this process are unintentional.

## 2018-11-18 NOTE — Progress Notes (Signed)
I agree with the above plan 

## 2018-12-31 ENCOUNTER — Other Ambulatory Visit: Payer: Self-pay | Admitting: Family Medicine

## 2018-12-31 DIAGNOSIS — Z1589 Genetic susceptibility to other disease: Secondary | ICD-10-CM

## 2018-12-31 DIAGNOSIS — Z136 Encounter for screening for cardiovascular disorders: Secondary | ICD-10-CM

## 2019-01-12 ENCOUNTER — Ambulatory Visit
Admission: RE | Admit: 2019-01-12 | Discharge: 2019-01-12 | Disposition: A | Discharge status: Home / Self Care | Payer: No Typology Code available for payment source | Source: Ambulatory Visit | Attending: Family Medicine | Admitting: Family Medicine

## 2019-01-12 DIAGNOSIS — Z136 Encounter for screening for cardiovascular disorders: Secondary | ICD-10-CM

## 2019-01-12 DIAGNOSIS — Z1589 Genetic susceptibility to other disease: Secondary | ICD-10-CM

## 2019-03-09 ENCOUNTER — Ambulatory Visit (INDEPENDENT_AMBULATORY_CARE_PROVIDER_SITE_OTHER): Payer: Commercial Managed Care - PPO | Admitting: Adult Health

## 2019-03-09 ENCOUNTER — Encounter: Payer: Self-pay | Admitting: Adult Health

## 2019-03-09 ENCOUNTER — Other Ambulatory Visit: Payer: Self-pay

## 2019-03-09 VITALS — BP 147/79 | HR 73 | Temp 97.6°F | Ht 72.0 in | Wt 190.6 lb

## 2019-03-09 DIAGNOSIS — Z9989 Dependence on other enabling machines and devices: Secondary | ICD-10-CM

## 2019-03-09 DIAGNOSIS — G4733 Obstructive sleep apnea (adult) (pediatric): Secondary | ICD-10-CM | POA: Diagnosis not present

## 2019-03-09 NOTE — Progress Notes (Signed)
PATIENT: Ethan Reed DOB: 1950/08/05  REASON FOR VISIT: follow up HISTORY FROM: patient  HISTORY OF PRESENT ILLNESS: Today 03/09/19:  Ethan Reed is a 69 year old male with a history of obstructive sleep apnea on CPAP.  He returns today for follow-up.  His download indicates that he uses machine 27 out of 30 days for compliance of 90%.  He uses machine greater than 4 hours 26 days for compliance of 87%.  On average he uses his machine 5 hours and 33 minutes.  His residual AHI is 1.5 on 5 to 11 cm of water with EPR of 2.  He does not have a significant leak.  He returns today for an evaluation.  HISTORY 09/04/18:  Ethan Reed is a 69 year old male with a history of obstructive sleep apnea.  He returns today for follow-up.  His CPAP download indicates that he uses machine 24 out of 30 days for compliance of 80%.  He uses machine greater than 4 hours 17 days for compliance of 57%.  On average he uses his machine 5 hours and 37 minutes.  His residual AHI is 2.8 on 5 to 15 cm of water with EPR of 2.  His leak in the 95th percentile is 4.5.  He states that he started to see the benefit of using the CPAP but has not noticed a big change yet.  Denies any new issues.  Returns today for evaluation.  REVIEW OF SYSTEMS: Out of a complete 14 system review of symptoms, the patient complains only of the following symptoms, and all other reviewed systems are negative.  Epworth sleepiness score 6, fatigue severity score 15  ALLERGIES: Allergies  Allergen Reactions  . Oxycodone Nausea And Vomiting and Other (See Comments)    Dizziness    HOME MEDICATIONS: Outpatient Medications Prior to Visit  Medication Sig Dispense Refill  . aspirin EC 81 MG EC tablet Take 1 tablet (81 mg total) by mouth daily. 30 tablet 0  . atorvastatin (LIPITOR) 40 MG tablet Take 1 tablet (40 mg total) by mouth daily at 6 PM. 30 tablet 0  . hydrochlorothiazide (MICROZIDE) 12.5 MG capsule Take 12.5 mg by mouth daily.     . irbesartan (AVAPRO) 75 MG tablet Take 75 mg by mouth daily.    Marland Kitchen UNABLE TO FIND Med Name: Herbal tea     No facility-administered medications prior to visit.    PAST MEDICAL HISTORY: Past Medical History:  Diagnosis Date  . CVA (cerebral vascular accident) (HCC)   . Dysrhythmia   . Hypertension   . Medical history non-contributory     PAST SURGICAL HISTORY: No past surgical history on file.  FAMILY HISTORY: Family History  Problem Relation Age of Onset  . Heart attack Mother   . Lung cancer Father   . Cancer Sister     SOCIAL HISTORY: Social History   Socioeconomic History  . Marital status: Widowed    Spouse name: Not on file  . Number of children: Not on file  . Years of education: Not on file  . Highest education level: Not on file  Occupational History  . Not on file  Tobacco Use  . Smoking status: Never Smoker  . Smokeless tobacco: Never Used  Substance and Sexual Activity  . Alcohol use: No  . Drug use: No  . Sexual activity: Yes  Other Topics Concern  . Not on file  Social History Narrative   Live alone.  Retired Research officer, political party.  Children grown-3, one  local.  Caffeine: non caffeine.  Education:  HS.     Social Determinants of Health   Financial Resource Strain:   . Difficulty of Paying Living Expenses: Not on file  Food Insecurity:   . Worried About Charity fundraiser in the Last Year: Not on file  . Ran Out of Food in the Last Year: Not on file  Transportation Needs:   . Lack of Transportation (Medical): Not on file  . Lack of Transportation (Non-Medical): Not on file  Physical Activity:   . Days of Exercise per Week: Not on file  . Minutes of Exercise per Session: Not on file  Stress:   . Feeling of Stress : Not on file  Social Connections:   . Frequency of Communication with Friends and Family: Not on file  . Frequency of Social Gatherings with Friends and Family: Not on file  . Attends Religious Services: Not on file  . Active  Member of Clubs or Organizations: Not on file  . Attends Archivist Meetings: Not on file  . Marital Status: Not on file  Intimate Partner Violence:   . Fear of Current or Ex-Partner: Not on file  . Emotionally Abused: Not on file  . Physically Abused: Not on file  . Sexually Abused: Not on file      PHYSICAL EXAM  Vitals:   03/09/19 0818  BP: (!) 147/79  Pulse: 73  Temp: 97.6 F (36.4 C)  Weight: 190 lb 9.6 oz (86.5 kg)  Height: 6' (1.829 m)   Body mass index is 25.85 kg/m. Generalized: Well developed, in no acute distress  Chest: Lungs clear to auscultation bilaterally  Neurological examination  Mentation: Alert oriented to time, place, history taking. Follows all commands speech and language fluent Cranial nerve II-XII: Extraocular movements were full, visual field were full on confrontational test Head turning and shoulder shrug  were normal and symmetric. Motor: The motor testing reveals 5 over 5 strength of all 4 extremities. Good symmetric motor tone is noted throughout.  Sensory: Sensory testing is intact to soft touch on all 4 extremities. No evidence of extinction is noted.  Gait and station: Gait is normal.    DIAGNOSTIC DATA (LABS, IMAGING, TESTING) - I reviewed patient records, labs, notes, testing and imaging myself where available.  Lab Results  Component Value Date   WBC 5.3 02/14/2018   HGB 16.3 02/14/2018   HCT 49.0 02/14/2018   MCV 91.4 02/14/2018   PLT 184 02/14/2018      Component Value Date/Time   NA 139 02/14/2018 1443   K 3.6 02/14/2018 1443   CL 106 02/14/2018 1443   CO2 23 02/14/2018 1443   GLUCOSE 103 (H) 02/14/2018 1443   BUN 12 02/14/2018 1443   CREATININE 0.93 02/14/2018 1443   CALCIUM 9.2 02/14/2018 1443   PROT 7.6 02/14/2018 1443   ALBUMIN 3.8 02/14/2018 1443   AST 18 02/14/2018 1443   ALT 23 02/14/2018 1443   ALKPHOS 56 02/14/2018 1443   BILITOT 0.9 02/14/2018 1443   GFRNONAA >60 02/14/2018 1443   GFRAA >60  02/14/2018 1443   Lab Results  Component Value Date   CHOL 214 (H) 02/15/2018   HDL 34 (L) 02/15/2018   LDLCALC 150 (H) 02/15/2018   TRIG 148 02/15/2018   CHOLHDL 6.3 02/15/2018   Lab Results  Component Value Date   HGBA1C 5.8 (H) 02/15/2018      ASSESSMENT AND PLAN 69 y.o. year old male  has a  past medical history of CVA (cerebral vascular accident) (HCC), Dysrhythmia, Hypertension, and Medical history non-contributory. here with:  1. Obstructive sleep apnea on CPAP  The patient's CPAP download shows excellent compliance and good treatment of his apnea.  He is encouraged to continue using CPAP nightly and greater than 4 hours each night.  He is advised that if his symptoms worsen or he develops new symptoms he should let us know.  He will follow-up in 1 year or sooner if needed  I spent 15 minutes with the patient. 50% of this time was spent reviewing CPAP download  Butch Penny, MSN, NP-C 03/09/2019, 8:44 AM [facility] [address] [place], [place] [address] [phone]

## 2019-03-09 NOTE — Patient Instructions (Signed)

## 2019-04-16 ENCOUNTER — Ambulatory Visit: Payer: No Typology Code available for payment source | Attending: Internal Medicine

## 2019-04-16 DIAGNOSIS — Z23 Encounter for immunization: Secondary | ICD-10-CM

## 2019-04-16 NOTE — Progress Notes (Signed)
   Covid-19 Vaccination Clinic  Name:  Ethan Reed    MRN: [id number] DOB: November 20, 1950  04/16/2019  Ethan Reed was observed post Covid-19 immunization for 15 minutes without incident. He was provided with Vaccine Information Sheet and instruction to access the V-Safe system.   Ethan Reed was instructed to call 911 with any severe reactions post vaccine: Marland Kitchen Difficulty breathing  . Swelling of face and throat  . A fast heartbeat  . A bad rash all over body  . Dizziness and weakness   Immunizations Administered    Name Date Dose VIS Date Route   Pfizer COVID-19 Vaccine 04/16/2019  3:04 PM 0.3 mL 01/02/2019 Intramuscular   Manufacturer: [employer], [employer]   Lot: [id number]   NDC: [address]-4

## 2019-05-11 ENCOUNTER — Ambulatory Visit: Payer: No Typology Code available for payment source | Attending: Internal Medicine

## 2019-05-11 DIAGNOSIS — Z23 Encounter for immunization: Secondary | ICD-10-CM

## 2019-05-11 NOTE — Progress Notes (Signed)
   Covid-19 Vaccination Clinic  Name:  Ethan Reed    MRN: [id number] DOB: 13-Oct-1950  05/11/2019  [name] was observed post Covid-19 immunization for 15 minutes without incident. He was provided with Vaccine Information Sheet and instruction to access the V-Safe system.   [name] was instructed to call 911 with any severe reactions post vaccine: Marland Kitchen Difficulty breathing  . Swelling of face and throat  . A fast heartbeat  . A bad rash all over body  . Dizziness and weakness   Immunizations Administered    Name Date Dose VIS Date Route   Pfizer COVID-19 Vaccine 05/11/2019  1:57 PM 0.3 mL 03/18/2018 Intramuscular   Manufacturer: [employer], [employer]   Lot: [id number]   NDC: [address]-2

## 2020-03-09 ENCOUNTER — Encounter: Payer: Self-pay | Admitting: Adult Health

## 2020-03-09 ENCOUNTER — Ambulatory Visit (INDEPENDENT_AMBULATORY_CARE_PROVIDER_SITE_OTHER): Payer: Medicare Other | Admitting: Adult Health

## 2020-03-09 VITALS — BP 131/70 | HR 76 | Ht 72.0 in | Wt 190.0 lb

## 2020-03-09 DIAGNOSIS — G4733 Obstructive sleep apnea (adult) (pediatric): Secondary | ICD-10-CM

## 2020-03-09 DIAGNOSIS — Z9989 Dependence on other enabling machines and devices: Secondary | ICD-10-CM | POA: Diagnosis not present

## 2020-03-09 NOTE — Progress Notes (Signed)
PATIENT: Ethan Reed DOB: Jul 04, 1950  REASON FOR VISIT: follow up HISTORY FROM: patient  HISTORY OF PRESENT ILLNESS: Today 03/09/20:  Mr. Ethan Reed is a 70 year old male with a history of obstructive sleep apnea on CPAP.  He reports that the CPAP is working well for him.  He denies any new issues.  Returns today for follow-up.  Compliance Report Compliance Payor Standard Usage 02/08/2020 - 03/08/2020 Usage days 30/30 days (100%) >= 4 hours 27 days (90%) Average usage (days used) 6 hours 5 minutes  AirSense 10 AutoSet Serial number [id number] Mode AutoSet Min Pressure 5 cmH2O Max Pressure 11 cmH2O EPR Fulltime EPR level 2 Response Soft  Therapy Pressure - cmH2O Median: 5.9 95th percentile: 7.8 Maximum: 8.8 Leaks - L/min Median: 8.9 95th percentile: 30.4 Maximum: 42.0 Events per hour AI: 2.7 HI: 0.5 AHI: 3.2 Apnea Index Central: 1.9 Obstructive: 0.2 Unknown: 0.5  03/09/19: Mr. [name] is a 70 year old male with a history of obstructive sleep apnea on CPAP.  He returns today for follow-up.  His download indicates that he uses machine 27 out of 30 days for compliance of 90%.  He uses machine greater than 4 hours 26 days for compliance of 87%.  On average he uses his machine 5 hours and 33 minutes.  His residual AHI is 1.5 on 5 to 11 cm of water with EPR of 2.  He does not have a significant leak.  He returns today for an evaluation.  HISTORY 09/04/18:  Mr. Ethan Reed is a 70 year old male with a history of obstructive sleep apnea.  He returns today for follow-up.  His CPAP download indicates that he uses machine 24 out of 30 days for compliance of 80%.  He uses machine greater than 4 hours 17 days for compliance of 57%.  On average he uses his machine 5 hours and 37 minutes.  His residual AHI is 2.8 on 5 to 15 cm of water with EPR of 2.  His leak in the 95th percentile is 4.5.  He states that he started to see the benefit of using the CPAP but has not noticed a big change  yet.  Denies any new issues.  Returns today for evaluation.  REVIEW OF SYSTEMS: Out of a complete 14 system review of symptoms, the patient complains only of the following symptoms, and all other reviewed systems are negative.  Epworth sleepiness score 8 fatigue severity score 33  ALLERGIES: Allergies  Allergen Reactions  . Oxycodone Nausea And Vomiting and Other (See Comments)    Dizziness    HOME MEDICATIONS: Outpatient Medications Prior to Visit  Medication Sig Dispense Refill  . aspirin EC 81 MG EC tablet Take 1 tablet (81 mg total) by mouth daily. 30 tablet 0  . atorvastatin (LIPITOR) 40 MG tablet Take 1 tablet (40 mg total) by mouth daily at 6 PM. 30 tablet 0  . hydrochlorothiazide (MICROZIDE) 12.5 MG capsule Take 12.5 mg by mouth daily.    . irbesartan (AVAPRO) 75 MG tablet Take 75 mg by mouth daily.    Marland Kitchen UNABLE TO FIND Med Name: Herbal tea     No facility-administered medications prior to visit.    PAST MEDICAL HISTORY: Past Medical History:  Diagnosis Date  . CVA (cerebral vascular accident) (HCC)   . Dysrhythmia   . Hypertension   . Medical history non-contributory     PAST SURGICAL HISTORY: No past surgical history on file.  FAMILY HISTORY: Family History  Problem Relation Age of Onset  .  Heart attack Mother   . Lung cancer Father   . Cancer Sister     SOCIAL HISTORY: Social History   Socioeconomic History  . Marital status: Widowed    Spouse name: Not on file  . Number of children: Not on file  . Years of education: Not on file  . Highest education level: Not on file  Occupational History  . Not on file  Tobacco Use  . Smoking status: Never Smoker  . Smokeless tobacco: Never Used  Vaping Use  . Vaping Use: Never used  Substance and Sexual Activity  . Alcohol use: No  . Drug use: No  . Sexual activity: Yes  Other Topics Concern  . Not on file  Social History Narrative   Live alone.  Retired Research officer, political party.  Children grown-3, one local.   Caffeine: non caffeine.  Education:  HS.     Social Determinants of Health   Financial Resource Strain: Not on file  Food Insecurity: Not on file  Transportation Needs: Not on file  Physical Activity: Not on file  Stress: Not on file  Social Connections: Not on file  Intimate Partner Violence: Not on file      PHYSICAL EXAM  There were no vitals filed for this visit. There is no height or weight on file to calculate BMI. Generalized: Well developed, in no acute distress  Chest: Lungs clear to auscultation bilaterally  Neurological examination  Mentation: Alert oriented to time, place, history taking. Follows all commands speech and language fluent Cranial nerve II-XII: Extraocular movements were full, visual field were full on confrontational test Head turning and shoulder shrug  were normal and symmetric. Motor: The motor testing reveals 5 over 5 strength of all 4 extremities. Good symmetric motor tone is noted throughout.  Sensory: Sensory testing is intact to soft touch on all 4 extremities. No evidence of extinction is noted.  Gait and station: Gait is normal.    DIAGNOSTIC DATA (LABS, IMAGING, TESTING) - I reviewed patient records, labs, notes, testing and imaging myself where available.  Lab Results  Component Value Date   WBC 5.3 02/14/2018   HGB 16.3 02/14/2018   HCT 49.0 02/14/2018   MCV 91.4 02/14/2018   PLT 184 02/14/2018      Component Value Date/Time   NA 139 02/14/2018 1443   K 3.6 02/14/2018 1443   CL 106 02/14/2018 1443   CO2 23 02/14/2018 1443   GLUCOSE 103 (H) 02/14/2018 1443   BUN 12 02/14/2018 1443   CREATININE 0.93 02/14/2018 1443   CALCIUM 9.2 02/14/2018 1443   PROT 7.6 02/14/2018 1443   ALBUMIN 3.8 02/14/2018 1443   AST 18 02/14/2018 1443   ALT 23 02/14/2018 1443   ALKPHOS 56 02/14/2018 1443   BILITOT 0.9 02/14/2018 1443   GFRNONAA >60 02/14/2018 1443   GFRAA >60 02/14/2018 1443   Lab Results  Component Value Date   CHOL 214 (H)  02/15/2018   HDL 34 (L) 02/15/2018   LDLCALC 150 (H) 02/15/2018   TRIG 148 02/15/2018   CHOLHDL 6.3 02/15/2018   Lab Results  Component Value Date   HGBA1C 5.8 (H) 02/15/2018      ASSESSMENT AND PLAN 70 y.o. year old male  has a past medical history of CVA (cerebral vascular accident) (HCC), Dysrhythmia, Hypertension, and Medical history non-contributory. here with:  1. Obstructive sleep apnea on CPAP  The patient's CPAP download shows excellent compliance and good treatment of his apnea.  He is encouraged to  continue using CPAP nightly and greater than 4 hours each night.  He is advised that if his symptoms worsen or he develops new symptoms he should let us know.  He will follow-up in 1 year or sooner if needed  I spent 20 minutes of face-to-face and non-face-to-face time with patient.  This included previsit chart review, lab review, study review, order entry, electronic health record documentation, patient education.   Butch Penny, MSN, NP-C 03/09/2020, 1:06 PM [facility] [address] [place], [place] [address] [phone]

## 2020-03-09 NOTE — Patient Instructions (Signed)
Continue using CPAP nightly and greater than 4 hours each night °If your symptoms worsen or you develop new symptoms please let us know.  ° °

## 2020-06-25 ENCOUNTER — Encounter (HOSPITAL_COMMUNITY): Payer: Self-pay

## 2020-06-25 ENCOUNTER — Other Ambulatory Visit: Payer: Self-pay

## 2020-06-25 ENCOUNTER — Emergency Department (HOSPITAL_COMMUNITY)
Admission: EM | Admit: 2020-06-25 | Discharge: 2020-06-25 | Disposition: A | Discharge status: Home / Self Care | Payer: Medicare Other | Attending: Emergency Medicine | Admitting: Emergency Medicine

## 2020-06-25 ENCOUNTER — Emergency Department (HOSPITAL_COMMUNITY): Payer: Medicare Other

## 2020-06-25 DIAGNOSIS — I10 Essential (primary) hypertension: Secondary | ICD-10-CM | POA: Diagnosis not present

## 2020-06-25 DIAGNOSIS — N134 Hydroureter: Secondary | ICD-10-CM

## 2020-06-25 DIAGNOSIS — N132 Hydronephrosis with renal and ureteral calculous obstruction: Secondary | ICD-10-CM | POA: Insufficient documentation

## 2020-06-25 DIAGNOSIS — Z79899 Other long term (current) drug therapy: Secondary | ICD-10-CM | POA: Diagnosis not present

## 2020-06-25 DIAGNOSIS — N201 Calculus of ureter: Secondary | ICD-10-CM

## 2020-06-25 DIAGNOSIS — N1330 Unspecified hydronephrosis: Secondary | ICD-10-CM

## 2020-06-25 DIAGNOSIS — Z7982 Long term (current) use of aspirin: Secondary | ICD-10-CM | POA: Diagnosis not present

## 2020-06-25 DIAGNOSIS — N200 Calculus of kidney: Secondary | ICD-10-CM

## 2020-06-25 DIAGNOSIS — R109 Unspecified abdominal pain: Secondary | ICD-10-CM | POA: Diagnosis present

## 2020-06-25 LAB — COMPREHENSIVE METABOLIC PANEL
ALT: 20 U/L (ref 0–44)
AST: 24 U/L (ref 15–41)
Albumin: 4 g/dL (ref 3.5–5.0)
Alkaline Phosphatase: 80 U/L (ref 38–126)
Anion gap: 13 (ref 5–15)
BUN: 19 mg/dL (ref 8–23)
CO2: 25 mmol/L (ref 22–32)
Calcium: 8.9 mg/dL (ref 8.9–10.3)
Chloride: 99 mmol/L (ref 98–111)
Creatinine, Ser: 1.21 mg/dL (ref 0.61–1.24)
GFR, Estimated: >60 mL/min (ref >60)
Glucose, Bld: 192 mg/dL — ABNORMAL HIGH (ref 70–99)
Potassium: 3.4 mmol/L — ABNORMAL LOW (ref 3.5–5.1)
Sodium: 137 mmol/L (ref 135–145)
Total Bilirubin: 1.1 mg/dL (ref 0.3–1.2)
Total Protein: 7.6 g/dL (ref 6.5–8.1)

## 2020-06-25 LAB — CBC
HCT: 46.9 % (ref 39.0–52.0)
Hemoglobin: 16 g/dL (ref 13.0–17.0)
MCH: 31.4 pg (ref 26.0–34.0)
MCHC: 34.1 g/dL (ref 30.0–36.0)
MCV: 92 fL (ref 80.0–100.0)
Platelets: 207 10*3/uL (ref 150–400)
RBC: 5.1 MIL/uL (ref 4.22–5.81)
RDW: 12.6 % (ref 11.5–15.5)
WBC: 12.2 10*3/uL — ABNORMAL HIGH (ref 4.0–10.5)
nRBC: 0 % (ref 0.0–0.2)

## 2020-06-25 LAB — URINALYSIS, ROUTINE W REFLEX MICROSCOPIC
Bilirubin Urine: NEGATIVE
Glucose, UA: NEGATIVE mg/dL
Ketones, ur: NEGATIVE mg/dL
Leukocytes,Ua: NEGATIVE
Nitrite: NEGATIVE
Protein, ur: NEGATIVE mg/dL
Specific Gravity, Urine: 1.018 (ref 1.005–1.030)
pH: 6 (ref 5.0–8.0)

## 2020-06-25 LAB — LIPASE, BLOOD: Lipase: 27 U/L (ref 11–51)

## 2020-06-25 MED ORDER — OXYCODONE-ACETAMINOPHEN 5-325 MG PO TABS: 1.0000 | ORAL_TABLET | Freq: Once | ORAL | Status: DC

## 2020-06-25 MED ORDER — TAMSULOSIN HCL 0.4 MG PO CAPS: 0.4000 mg | ORAL_CAPSULE | Freq: Every day | ORAL | 0 refills | Status: AC

## 2020-06-25 MED ORDER — ONDANSETRON 4 MG PO TBDP: 4.0000 mg | ORAL_TABLET | Freq: Three times a day (TID) | ORAL | 0 refills | Status: DC | PRN

## 2020-06-25 MED ORDER — OXYCODONE HCL 5 MG PO TABS: 5.0000 mg | ORAL_TABLET | Freq: Four times a day (QID) | ORAL | 0 refills | Status: AC | PRN

## 2020-06-25 MED ORDER — TAMSULOSIN HCL 0.4 MG PO CAPS
0.4000 mg | ORAL_CAPSULE | Freq: Once | ORAL | Status: AC
  Administered 2020-06-25: 0.4 mg via ORAL
  Filled 2020-06-25: qty 1

## 2020-06-25 MED ORDER — ACETAMINOPHEN 325 MG PO TABS: 650.0000 mg | ORAL_TABLET | Freq: Once | ORAL | Status: DC

## 2020-06-25 MED ORDER — ONDANSETRON 4 MG PO TBDP
4.0000 mg | ORAL_TABLET | Freq: Once | ORAL | Status: AC | PRN
  Administered 2020-06-25: 4 mg via ORAL
  Filled 2020-06-25: qty 1

## 2020-06-25 NOTE — ED Provider Notes (Signed)
[facility] EMERGENCY DEPARTMENT Provider Note   CSN: [id number] Arrival date & time: 06/25/20  1053     History No chief complaint on file.   Ethan Reed is a 70 y.o. male with history of stroke, hypertension, kidney stones presents to the ED for evaluation of sudden onset right flank pain that radiates to the right side of his abdomen and lower abdomen.  This has been constant but worsens in waves that are described as severe.  Associated with nausea, vomiting, feeling hot all over, lightheadedness.  At first he thought they were gas pains and that he needed to have a bowel movement.  He made himself have 2 bowel movements and passed a lot of gas but states this did not make the pain any better.  His wife gave him celery juice to try to "clean him out".  He has vomited twice.  States he was told he had kidney stones many years ago but has never actually passed 1 and thinks he is passing a kidney stone now.  Denies hematuria, dysuria, increased urinary frequency or significant changes in urine output.  Denies fever.  No chest pain or shortness of breath.  No testicular pain.  No abdominal surgeries.  No interventions at home.  She received ODT Zofran in triage and feels like his nausea is better.  HPI     Past Medical History:  Diagnosis Date  . CVA (cerebral vascular accident) (HCC)   . Dysrhythmia   . Hypertension   . Medical history non-contributory     Patient Active Problem List   Diagnosis Date Noted  . Acute arterial ischemic stroke, vertebrobasilar, thalamic, left (HCC) 05/22/2018  . Snoring 05/22/2018  . Stroke (HCC) 04/02/2018  . Hyperlipidemia 04/02/2018  . Somnolence, daytime 04/02/2018  . TIA (transient ischemic attack) 02/14/2018  . Essential hypertension 02/14/2018    History reviewed. No pertinent surgical history.     Family History  Problem Relation Age of Onset  . Heart attack Mother   . Lung cancer Father   . Cancer Sister      Social History   Tobacco Use  . Smoking status: Never Smoker  . Smokeless tobacco: Never Used  Vaping Use  . Vaping Use: Never used  Substance Use Topics  . Alcohol use: No  . Drug use: No    Home Medications Prior to Admission medications   Medication Sig Start Date End Date Taking? Authorizing Provider  ondansetron (ZOFRAN ODT) 4 MG disintegrating tablet Take 1 tablet (4 mg total) by mouth every 8 (eight) hours as needed for nausea or vomiting. 06/25/20  Yes Liberty Handy, PA-C  oxyCODONE (OXY IR/ROXICODONE) 5 MG immediate release tablet Take 1 tablet (5 mg total) by mouth every 6 (six) hours as needed for up to 3 days for severe pain. 06/25/20 06/28/20 Yes Liberty Handy, PA-C  tamsulosin (FLOMAX) 0.4 MG CAPS capsule Take 1 capsule (0.4 mg total) by mouth daily for 15 days. 06/25/20 07/10/20 Yes Liberty Handy, PA-C  aspirin EC 81 MG EC tablet Take 1 tablet (81 mg total) by mouth daily. 02/16/18   Mikhail, Nita Sells, DO  atorvastatin (LIPITOR) 40 MG tablet Take 1 tablet (40 mg total) by mouth daily at 6 PM. 02/15/18   Edsel Petrin, DO  hydrochlorothiazide (MICROZIDE) 12.5 MG capsule Take 12.5 mg by mouth daily.    [provider]  irbesartan (AVAPRO) 75 MG tablet Take 75 mg by mouth daily.    [provider]  UNABLE TO FIND Med Name: Herbal tea    [provider]    Allergies    Oxycodone  Review of Systems   Review of Systems  Gastrointestinal: Positive for abdominal pain, nausea and vomiting.  Genitourinary: Positive for flank pain.  All other systems reviewed and are negative.   Physical Exam Updated Vital Signs BP (!) 141/84 (BP Location: Right Arm)   Pulse 87   Temp 97.7 F (36.5 C) (Oral)   Resp 17   SpO2 96%   Physical Exam Vitals and nursing note reviewed.  Constitutional:      Appearance: He is well-developed.     Comments: Non toxic.  HENT:     Head: Normocephalic and atraumatic.     Nose: Nose normal.  Eyes:      Conjunctiva/sclera: Conjunctivae normal.  Cardiovascular:     Rate and Rhythm: Normal rate and regular rhythm.     Heart sounds: Normal heart sounds.     Comments: 1+ radial and DP pulses bilaterally  Pulmonary:     Effort: Pulmonary effort is normal.     Breath sounds: Normal breath sounds.  Abdominal:     General: Bowel sounds are normal.     Palpations: Abdomen is soft.     Tenderness: There is abdominal tenderness (right mid abdominal, diffuse lower abdomen, R CVAT).     Comments: No G/R/R. Negative Murphy's and McBurney's. Active BS lower quadrants. Normal skin over abdomen/flank, no rash. No distention, rigidity, guarding. No obvious pulsatile mass  Musculoskeletal:        General: Normal range of motion.     Cervical back: Normal range of motion.  Skin:    General: Skin is warm and dry.     Capillary Refill: Capillary refill takes less than 2 seconds.  Neurological:     Mental Status: He is alert.     Comments: Sensation and strength intact in upper/lower extremities   Psychiatric:        Behavior: Behavior normal.     ED Results / Procedures / Treatments   Labs (all labs ordered are listed, but only abnormal results are displayed) Labs Reviewed  COMPREHENSIVE METABOLIC PANEL - Abnormal; Notable for the following components:      Result Value   Potassium 3.4 (*)    Glucose, Bld 192 (*)    All other components within normal limits  CBC - Abnormal; Notable for the following components:   WBC 12.2 (*)    All other components within normal limits  URINALYSIS, ROUTINE W REFLEX MICROSCOPIC - Abnormal; Notable for the following components:   Hgb urine dipstick MODERATE (*)    Bacteria, UA RARE (*)    All other components within normal limits  LIPASE, BLOOD    EKG None  Radiology CT Renal Stone Study  Result Date: 06/25/2020 CLINICAL DATA:  RIGHT flank pain with nausea and vomiting, acute onset this morning, thinks he is passing a kidney stone. EXAM: CT ABDOMEN AND  PELVIS WITHOUT CONTRAST TECHNIQUE: Multidetector CT imaging of the abdomen and pelvis was performed following the standard protocol without IV contrast. Sagittal and coronal MPR images reconstructed from axial data set. No oral contrast administered. COMPARISON:  05/13/2018 FINDINGS: Lower chest: Minimal bibasilar atelectasis. 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 Hepatobiliary: Gallbladder and liver normal appearance Pancreas: Normal appearance Spleen: Normal appearance Adrenals/Urinary Tract: Adrenal glands normal appearance. RIGHT hydronephrosis and hydroureter secondary to an 8 x 7 x 8 mm calculus just above  ureterovesical junction. Additional nonobstructing 6 mm RIGHT renal calculus. Minimal perinephric edema RIGHT kidney. Peripelvic cyst inferior pole LEFT kidney 3.8 x 2.7 cm image 38. LEFT ureter and bladder unremarkable. Stomach/Bowel: Normal appendix. Scattered colonic diverticulosis without evidence of diverticulitis. Stomach and bowel loops otherwise unremarkable. Vascular/Lymphatic: Aorta normal caliber without aneurysm. No adenopathy. Reproductive: Minimal prostatic enlargement. Seminal vesicles unremarkable Other: No free air or free fluid. No hernia or inflammatory process. Musculoskeletal: Unremarkable IMPRESSION: RIGHT hydronephrosis and hydroureter secondary to an 8 x 7 x 8 mm calculus just above ureterovesical junction. Additional nonobstructing 6 mm RIGHT renal calculus. LEFT renal cyst 3.8 x 2.7 cm. Minimal prostatic enlargement. Colonic diverticulosis without evidence of diverticulitis. Electronically Signed   By: Ulyses Southward M.D.   On: 06/25/2020 13:28    Procedures Procedures   Medications Ordered in ED Medications  oxyCODONE-acetaminophen (PERCOCET/ROXICET) 5-325 MG per tablet 1 tablet (has no administration in time range)  tamsulosin (FLOMAX) capsule 0.4 mg (has no administration in time range)  acetaminophen (TYLENOL) tablet 650 mg (has no  administration in time range)  ondansetron (ZOFRAN-ODT) disintegrating tablet 4 mg (4 mg Oral Given 06/25/20 1158)    ED Course  I have reviewed the triage vital signs and the nursing notes.  Pertinent labs & imaging results that were available during my care of the patient were reviewed by me and considered in my medical decision making (see chart for details).  Clinical Course as of 06/25/20 1556  Sat Jun 25, 2020  1355 CT Renal Stone Study IMPRESSION: RIGHT hydronephrosis and hydroureter secondary to an 8 x 7 x 8 mm calculus just above ureterovesical junction.  Additional nonobstructing 6 mm RIGHT renal calculus.  LEFT renal cyst 3.8 x 2.7 cm.  Minimal prostatic enlargement.  Colonic diverticulosis without evidence of diverticulitis. [CG]  1404 Re-evaluated patient. Not in significant discomfort. Updated on CT findings. UA was never collected, he will provide sample now. Will await UA results prior to consulting urology.  [CG]  1405 RBC / HPF: 11-20 [CG]  1504 Bacteria, UA(!): RARE [CG]  1548 Patient re-evaluated. Has no pain. Repaged urology.  [CG]    Clinical Course User Index [CG] Jerrell Mylar   MDM Rules/Calculators/A&P                           70 y.o. yo male presents to the ED for sudden onset right flank pain radiating to lower abdomen, nausea, vomiting.   Additional information obtained from chart, nursing and triage notes review  Chart review reveals - no recent imaging of abdomen/renal to evaluate for known nephrolithiasis   Ordered lab, imaging were personally reviewed and interpreted  Labs reveal - WBC 12.2, K 3.4. normal creatinine. UA without infection.   Imaging reveals - 8x7x8 m right ureteral stone above right ureterovesicular junction with mild hydronephrosis and hydroureter.  Additional nephrolithiasis noted as well, mildly enlarged prostate.  Medicines ordered - PO zofran, oxycodone, APAP, flomax  ED course & MDM 1550: patient  re-evaluated several times. Has no pain at this time. Paged urology x 2 given size of stone. Anticipate discharge with symptomatic management.  Explained to patient, in agreement.   1600: Spoke to Dr Marlou Porch, thinks patient may  Need shock therapy to pass stone but given no pain reasonable to discharge home and see if he will pass it on his own.  Patient can be seen in office Monday. Explained plan to patient who would rather go  home, follow up with urology as OP.   Portions of this note were generated with Scientist, clinical (histocompatibility and immunogenetics)Dragon dictation software. Dictation errors may occur despite best attempts at proofreading    Final Clinical Impression(s) / ED Diagnoses Final diagnoses:  Right ureteral stone  Hydronephrosis of right kidney  Hydroureter  Right nephrolithiasis    Rx / DC Orders ED Discharge Orders         Ordered    ondansetron (ZOFRAN ODT) 4 MG disintegrating tablet  Every 8 hours PRN        06/25/20 1553    tamsulosin (FLOMAX) 0.4 MG CAPS capsule  Daily        06/25/20 1553    oxyCODONE (OXY IR/ROXICODONE) 5 MG immediate release tablet  Every 6 hours PRN        06/25/20 1553           Liberty HandyGibbons, Quantay Zaremba J, PA-C 06/25/20 1556    Wynetta FinesMessick, Peter C, MD 06/26/20 1948

## 2020-06-25 NOTE — ED Triage Notes (Signed)
Patient complains of acute onset of right flank and lower abdominal pain with nausea since am. States that he thinks he is passing a stone.

## 2020-06-25 NOTE — ED Notes (Signed)
Urinal given to pt 

## 2020-06-25 NOTE — Discharge Instructions (Signed)
You were seen in the emergency department for pain in your right flank  CT showed an 8 x 7 x 8 mm stone at the end of the ureter almost at the bladder  I talked to Dr. Marlou Porch with [facility].  He feels that given that you have no pain you may have passed a stone or are close to passing the stone.  We will discharge you with pain medicines, nausea medicine.  Call his office, he states he can see you in the office on Monday or Tuesday.  If needed, they can schedule shock wave lithotripsy to help break up the stone and facilitate passage.  You may not need this procedure at all if you are able to pass the stone on your own  Stay hydrated, drink 2 L of water daily  Return for worsening or severe pain, fever greater than 100.25F, persistent vomiting, inability to urinate, or burning with urination.

## 2020-07-04 ENCOUNTER — Other Ambulatory Visit: Payer: Self-pay | Admitting: Urology

## 2020-07-04 DIAGNOSIS — N201 Calculus of ureter: Secondary | ICD-10-CM

## 2020-07-05 ENCOUNTER — Encounter (HOSPITAL_BASED_OUTPATIENT_CLINIC_OR_DEPARTMENT_OTHER): Payer: Self-pay | Admitting: Urology

## 2020-07-05 NOTE — Progress Notes (Signed)
Patient may continue to take his ASA according to [facility] Elita Quick) who consulted Dr. Alvester Morin. Patient made aware.

## 2020-07-05 NOTE — Progress Notes (Signed)
Patient to arrive at 0800 on 06/23/2020. History and medications reviewed. Pre-procedure instructions given. Patient takes low dose ASA daily. States that Dr. Alvester Morin told him he did not have to stop taking it prior to the ESWL. Left message for Pam at [facility] and informed patient I would call him back with clarification. NPO after MN on Sunday except for clear liquids until 0600. Patient instructed to bring his CPAP. Driver secured.

## 2020-07-11 ENCOUNTER — Encounter (HOSPITAL_BASED_OUTPATIENT_CLINIC_OR_DEPARTMENT_OTHER): Payer: Self-pay | Admitting: Certified Registered"

## 2020-07-11 ENCOUNTER — Ambulatory Visit (HOSPITAL_COMMUNITY): Payer: Medicare Other

## 2020-07-11 ENCOUNTER — Ambulatory Visit (HOSPITAL_BASED_OUTPATIENT_CLINIC_OR_DEPARTMENT_OTHER)
Admission: RE | Admit: 2020-07-11 | Discharge: 2020-07-11 | Disposition: A | Discharge status: Home / Self Care | Payer: Medicare Other | Attending: Urology | Admitting: Urology

## 2020-07-11 ENCOUNTER — Encounter (HOSPITAL_BASED_OUTPATIENT_CLINIC_OR_DEPARTMENT_OTHER): Payer: Self-pay | Admitting: Urology

## 2020-07-11 ENCOUNTER — Other Ambulatory Visit: Payer: Self-pay

## 2020-07-11 ENCOUNTER — Encounter (HOSPITAL_BASED_OUTPATIENT_CLINIC_OR_DEPARTMENT_OTHER)
Admission: RE | Disposition: A | Discharge status: Home / Self Care | Payer: Self-pay | Source: Home / Self Care | Attending: Urology

## 2020-07-11 DIAGNOSIS — N202 Calculus of kidney with calculus of ureter: Secondary | ICD-10-CM | POA: Insufficient documentation

## 2020-07-11 DIAGNOSIS — N201 Calculus of ureter: Secondary | ICD-10-CM

## 2020-07-11 HISTORY — PX: EXTRACORPOREAL SHOCK WAVE LITHOTRIPSY: SHX1557

## 2020-07-11 HISTORY — DX: Obstructive sleep apnea (adult) (pediatric): G47.33

## 2020-07-11 SURGERY — LITHOTRIPSY, ESWL
Anesthesia: LOCAL | Laterality: Right

## 2020-07-11 MED ORDER — SODIUM CHLORIDE 0.9 % IV SOLN: INTRAVENOUS | Status: DC

## 2020-07-11 MED ORDER — CIPROFLOXACIN HCL 500 MG PO TABS
ORAL_TABLET | ORAL | Status: AC
  Filled 2020-07-11: qty 1

## 2020-07-11 MED ORDER — DIAZEPAM 5 MG PO TABS
10.0000 mg | ORAL_TABLET | ORAL | Status: AC
  Administered 2020-07-11: 10 mg via ORAL

## 2020-07-11 MED ORDER — DIAZEPAM 5 MG PO TABS
ORAL_TABLET | ORAL | Status: AC
  Filled 2020-07-11: qty 2

## 2020-07-11 MED ORDER — CIPROFLOXACIN HCL 500 MG PO TABS
500.0000 mg | ORAL_TABLET | ORAL | Status: AC
  Administered 2020-07-11: 500 mg via ORAL

## 2020-07-11 MED ORDER — TAMSULOSIN HCL 0.4 MG PO CAPS: 0.4000 mg | ORAL_CAPSULE | Freq: Every day | ORAL | 1 refills | Status: DC

## 2020-07-11 MED ORDER — DIPHENHYDRAMINE HCL 25 MG PO CAPS
25.0000 mg | ORAL_CAPSULE | ORAL | Status: AC
  Administered 2020-07-11: 25 mg via ORAL

## 2020-07-11 MED ORDER — HYDROCODONE-ACETAMINOPHEN 5-325 MG PO TABS: 1.0000 | ORAL_TABLET | ORAL | 0 refills | Status: DC | PRN

## 2020-07-11 MED ORDER — DIPHENHYDRAMINE HCL 25 MG PO CAPS
ORAL_CAPSULE | ORAL | Status: AC
  Filled 2020-07-11: qty 1

## 2020-07-11 NOTE — H&P (Signed)
See scanned H&P

## 2020-07-11 NOTE — Op Note (Signed)
See [facility] OP note scanned into chart. Also because of the size, density, location and other factors that cannot be anticipated I feel this will likely be a staged procedure. This fact supersedes any indication in the scanned [facility] operative note to the contrary.  

## 2020-07-13 ENCOUNTER — Encounter (HOSPITAL_BASED_OUTPATIENT_CLINIC_OR_DEPARTMENT_OTHER): Payer: Self-pay | Admitting: Urology

## 2020-10-29 IMAGING — US US AORTA
1 series · 14 of 16 positions shown · non-contrast
Comparison: None.

CLINICAL DATA: Screening examination for patient with genetic
predisposition for aneurysm.

EXAM:
ULTRASOUND OF ABDOMINAL AORTA
TECHNIQUE: Ultrasound examination of the abdominal aorta and proximal common
iliac arteries was performed to evaluate for aneurysm. Additional
color and Doppler images of the distal aorta were obtained to
document patency.

[Series 1: us aorta · 0.25mm/px · 14 of 16 slices shown]
[im 1/16]
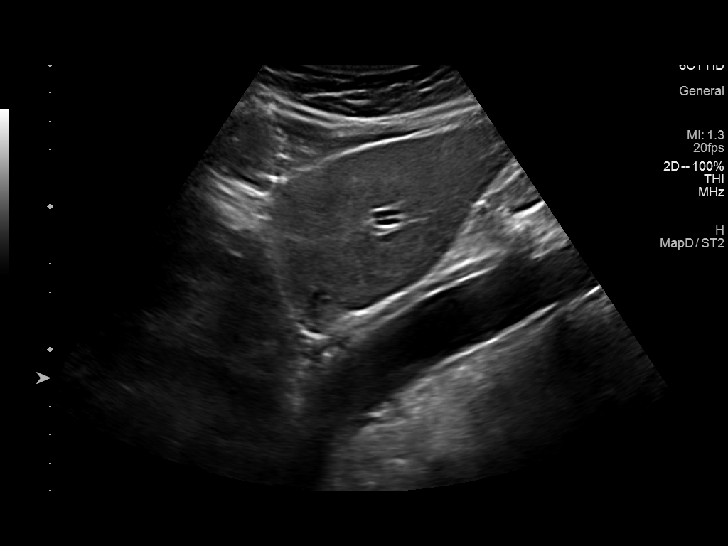
[im 2/16]
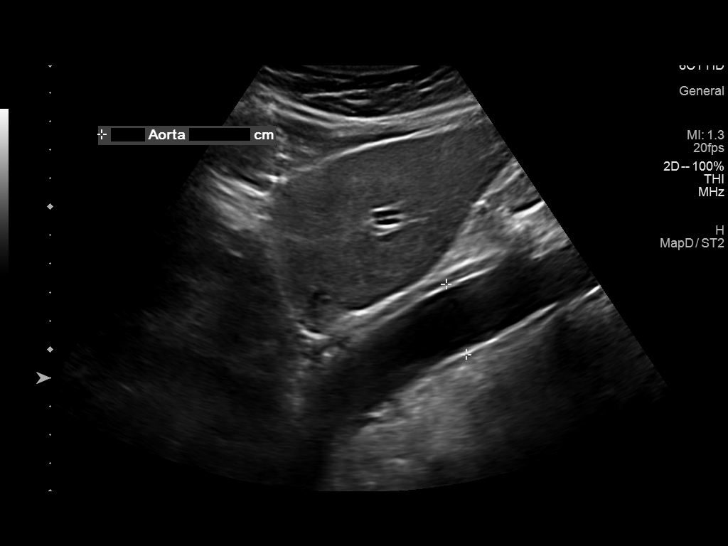
[im 3/16]
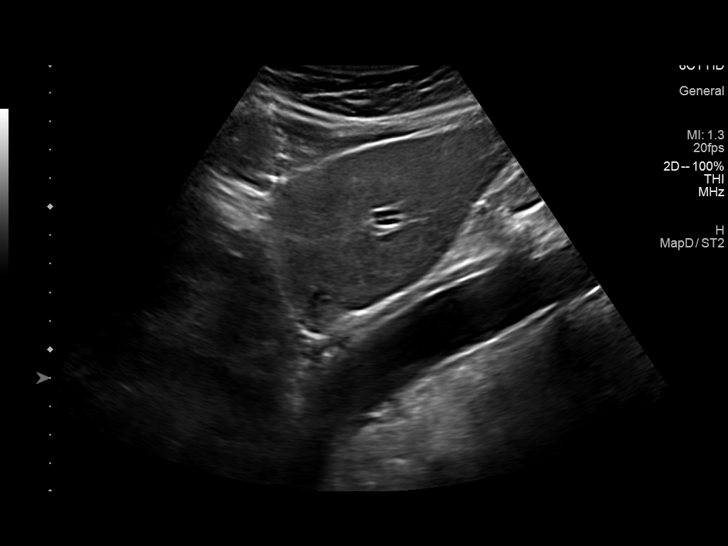
[im 5/16]
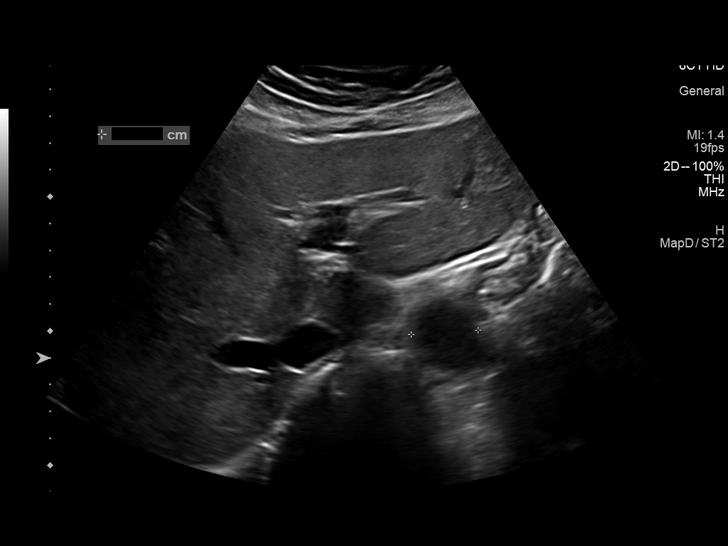
[im 6/16]
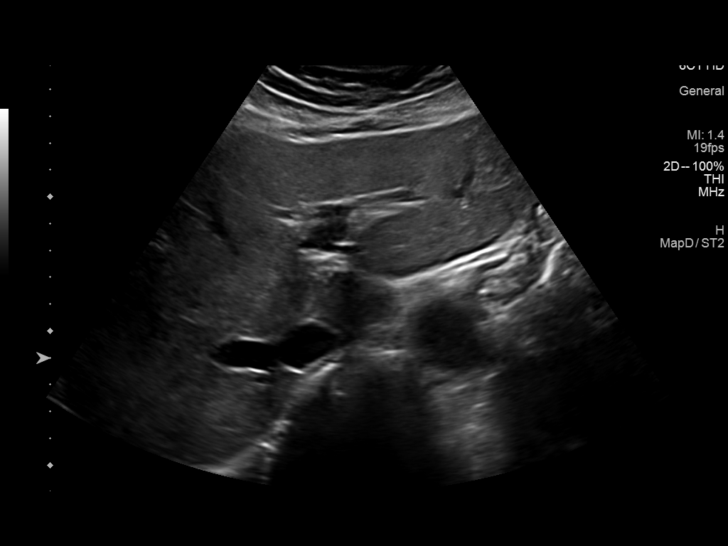
[im 7/16]
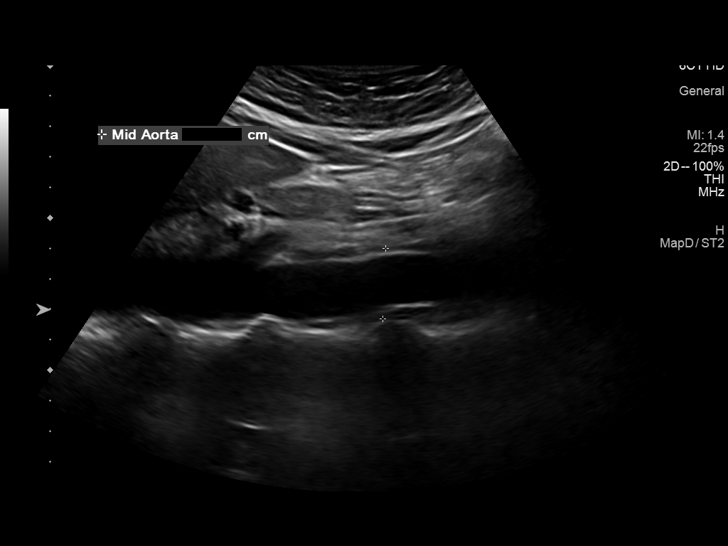
[im 8/16]
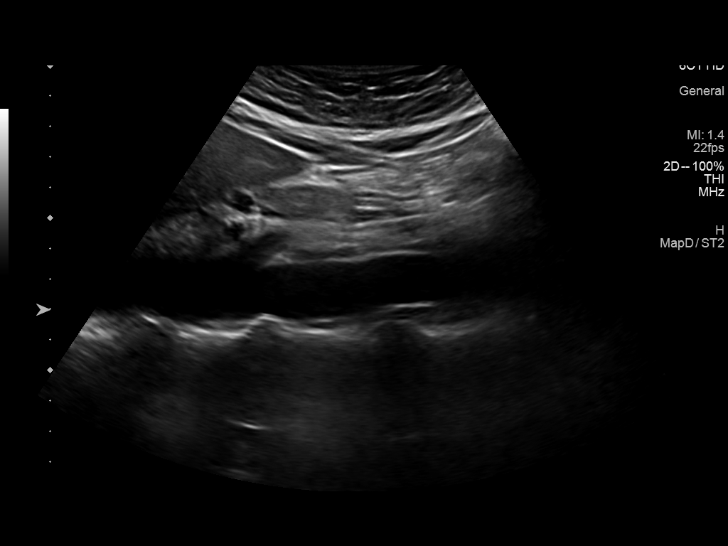
[im 9/16]
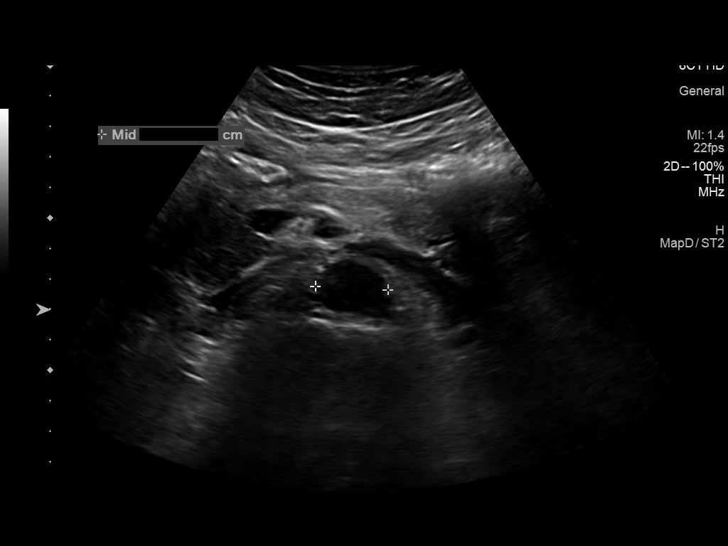
[im 10/16]
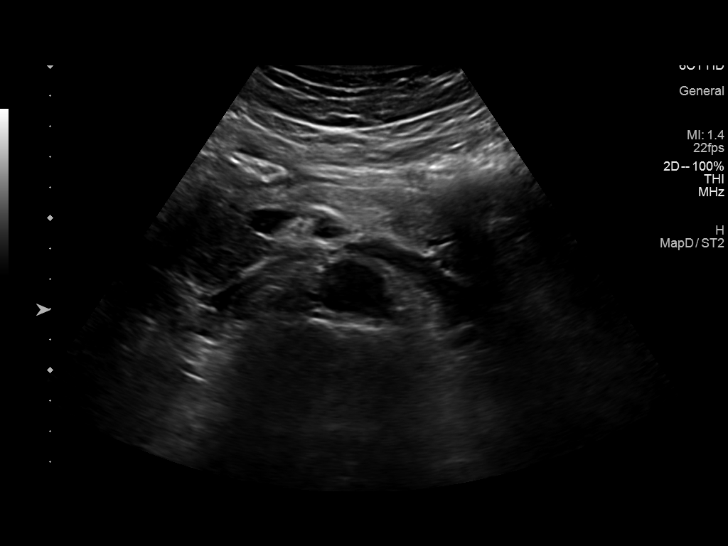
[im 11/16]
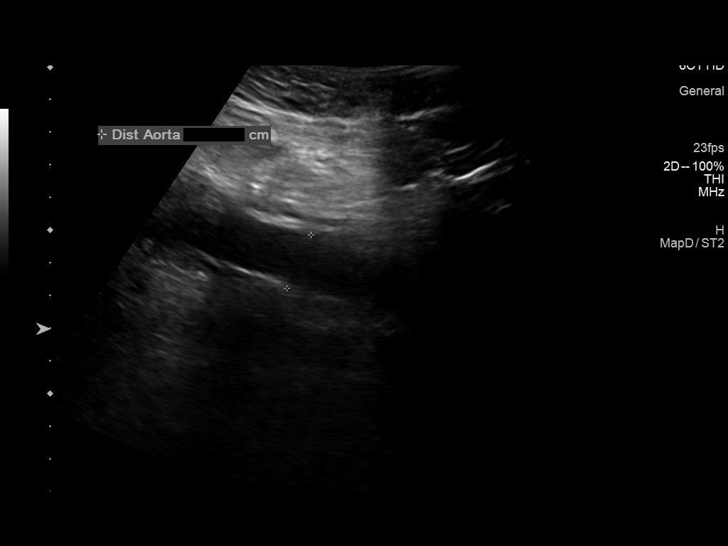
[im 13/16]
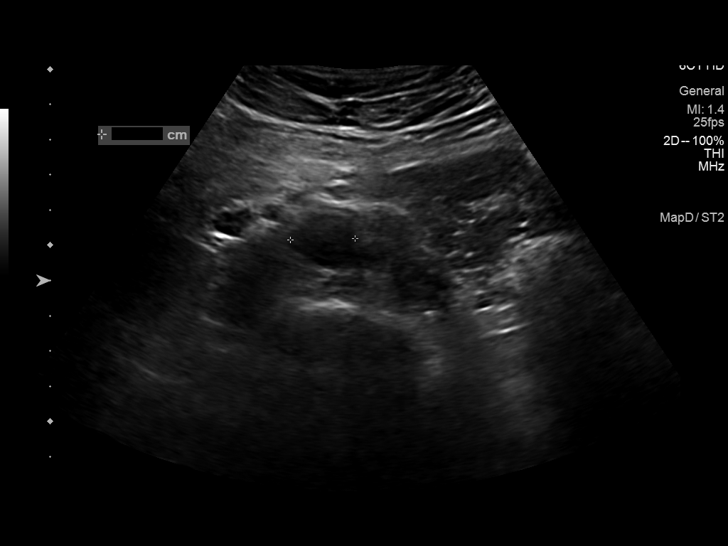
[im 14/16]
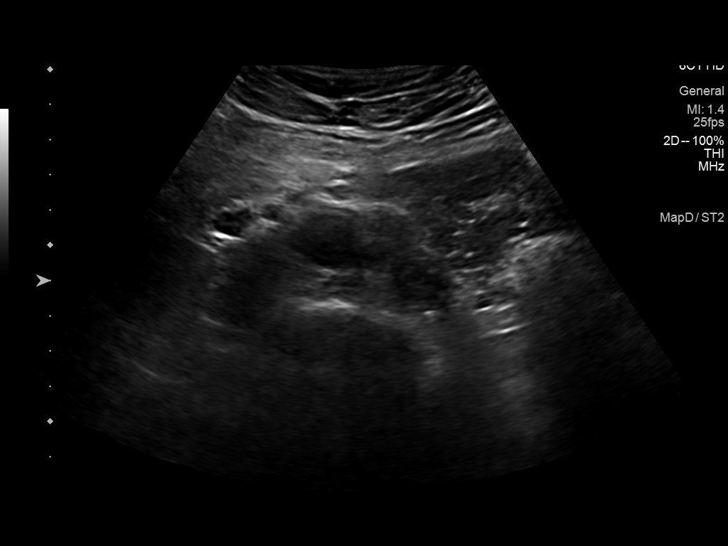
[im 15/16]
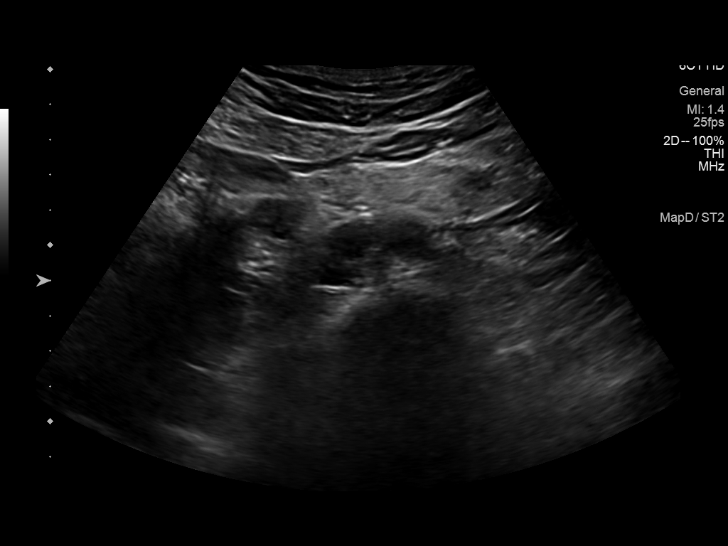
[im 16/16]
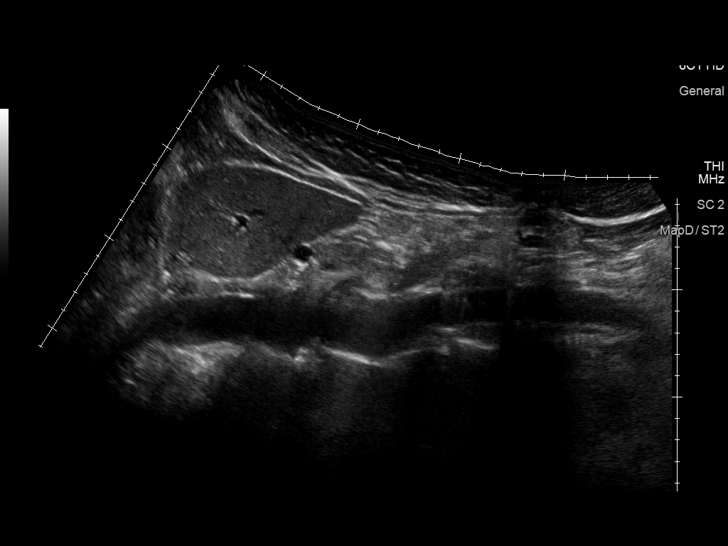

[14 of 16 positions shown; findings below may reference images not displayed]

FINDINGS: Abdominal aortic measurements as follows:

Proximal:  2.6 cm

Mid:  2.4 cm

Distal:  1.8 cm
Patent: Yes
IMPRESSION: Ectatic abdominal aorta at risk for aneurysm development. Recommend
followup by ultrasound in 5 years. This recommendation follows ACR
consensus guidelines: White Paper of the ACR Incidental Findings
Committee II on Vascular Findings. [HOSPITAL] 7313;

## 2021-01-22 ENCOUNTER — Encounter (HOSPITAL_COMMUNITY): Payer: Self-pay | Admitting: Emergency Medicine

## 2021-01-22 ENCOUNTER — Emergency Department (HOSPITAL_COMMUNITY)
Admission: EM | Admit: 2021-01-22 | Discharge: 2021-01-22 | Disposition: A | Discharge status: Home / Self Care | Payer: Medicare Other | Attending: Emergency Medicine | Admitting: Emergency Medicine

## 2021-01-22 ENCOUNTER — Emergency Department (HOSPITAL_COMMUNITY): Payer: Medicare Other

## 2021-01-22 ENCOUNTER — Other Ambulatory Visit: Payer: Self-pay

## 2021-01-22 DIAGNOSIS — N132 Hydronephrosis with renal and ureteral calculous obstruction: Secondary | ICD-10-CM | POA: Insufficient documentation

## 2021-01-22 DIAGNOSIS — N200 Calculus of kidney: Secondary | ICD-10-CM

## 2021-01-22 DIAGNOSIS — R1031 Right lower quadrant pain: Secondary | ICD-10-CM | POA: Diagnosis present

## 2021-01-22 DIAGNOSIS — I10 Essential (primary) hypertension: Secondary | ICD-10-CM | POA: Diagnosis not present

## 2021-01-22 DIAGNOSIS — Z7982 Long term (current) use of aspirin: Secondary | ICD-10-CM | POA: Insufficient documentation

## 2021-01-22 HISTORY — DX: Calculus of kidney: N20.0

## 2021-01-22 LAB — CBC WITH DIFFERENTIAL/PLATELET
Abs Immature Granulocytes: 0.07 10*3/uL (ref 0.00–0.07)
Basophils Absolute: 0 10*3/uL (ref 0.0–0.1)
Basophils Relative: 0 %
Eosinophils Absolute: 0.1 10*3/uL (ref 0.0–0.5)
Eosinophils Relative: 1 %
HCT: 50.7 % (ref 39.0–52.0)
Hemoglobin: 16.7 g/dL (ref 13.0–17.0)
Immature Granulocytes: 1 %
Lymphocytes Relative: 16 %
Lymphs Abs: 1.6 10*3/uL (ref 0.7–4.0)
MCH: 30.9 pg (ref 26.0–34.0)
MCHC: 32.9 g/dL (ref 30.0–36.0)
MCV: 93.7 fL (ref 80.0–100.0)
Monocytes Absolute: 0.5 10*3/uL (ref 0.1–1.0)
Monocytes Relative: 5 %
Neutro Abs: 8.1 10*3/uL — ABNORMAL HIGH (ref 1.7–7.7)
Neutrophils Relative %: 77 %
Platelets: 193 10*3/uL (ref 150–400)
RBC: 5.41 MIL/uL (ref 4.22–5.81)
RDW: 12.8 % (ref 11.5–15.5)
WBC: 10.4 10*3/uL (ref 4.0–10.5)
nRBC: 0 % (ref 0.0–0.2)

## 2021-01-22 LAB — COMPREHENSIVE METABOLIC PANEL
ALT: 21 U/L (ref 0–44)
AST: 26 U/L (ref 15–41)
Albumin: 4.1 g/dL (ref 3.5–5.0)
Alkaline Phosphatase: 71 U/L (ref 38–126)
Anion gap: 11 (ref 5–15)
BUN: 16 mg/dL (ref 8–23)
CO2: 25 mmol/L (ref 22–32)
Calcium: 9.6 mg/dL (ref 8.9–10.3)
Chloride: 103 mmol/L (ref 98–111)
Creatinine, Ser: 1.27 mg/dL — ABNORMAL HIGH (ref 0.61–1.24)
GFR, Estimated: >60 mL/min (ref >60)
Glucose, Bld: 157 mg/dL — ABNORMAL HIGH (ref 70–99)
Potassium: 3.5 mmol/L (ref 3.5–5.1)
Sodium: 139 mmol/L (ref 135–145)
Total Bilirubin: 1 mg/dL (ref 0.3–1.2)
Total Protein: 7.6 g/dL (ref 6.5–8.1)

## 2021-01-22 LAB — URINALYSIS, ROUTINE W REFLEX MICROSCOPIC
Bacteria, UA: NONE SEEN
Bilirubin Urine: NEGATIVE
Glucose, UA: NEGATIVE mg/dL
Ketones, ur: NEGATIVE mg/dL
Leukocytes,Ua: NEGATIVE
Nitrite: NEGATIVE
Protein, ur: NEGATIVE mg/dL
Specific Gravity, Urine: 1.014 (ref 1.005–1.030)
pH: 6 (ref 5.0–8.0)

## 2021-01-22 MED ORDER — TAMSULOSIN HCL 0.4 MG PO CAPS: 0.4000 mg | ORAL_CAPSULE | Freq: Every day | ORAL | 0 refills | Status: DC

## 2021-01-22 MED ORDER — ONDANSETRON 4 MG PO TBDP: 4.0000 mg | ORAL_TABLET | Freq: Three times a day (TID) | ORAL | 0 refills | Status: DC | PRN

## 2021-01-22 MED ORDER — ONDANSETRON HCL 4 MG/2ML IJ SOLN
4.0000 mg | Freq: Once | INTRAMUSCULAR | Status: DC
  Filled 2021-01-22: qty 2

## 2021-01-22 MED ORDER — HYDROCODONE-ACETAMINOPHEN 5-325 MG PO TABS: 2.0000 | ORAL_TABLET | Freq: Four times a day (QID) | ORAL | 0 refills | Status: DC | PRN

## 2021-01-22 MED ORDER — MORPHINE SULFATE (PF) 4 MG/ML IV SOLN
4.0000 mg | Freq: Once | INTRAVENOUS | Status: AC
  Administered 2021-01-22: 4 mg via INTRAVENOUS
  Filled 2021-01-22: qty 1

## 2021-01-22 MED ORDER — ONDANSETRON 4 MG PO TBDP
4.0000 mg | ORAL_TABLET | Freq: Once | ORAL | Status: AC
  Administered 2021-01-22: 4 mg via ORAL
  Filled 2021-01-22: qty 1

## 2021-01-22 MED ORDER — SODIUM CHLORIDE 0.9 % IV BOLUS
1000.0000 mL | Freq: Once | INTRAVENOUS | Status: AC
  Administered 2021-01-22: 1000 mL via INTRAVENOUS

## 2021-01-22 NOTE — ED Provider Notes (Signed)
[facility] EMERGENCY DEPARTMENT Provider Note   CSN: [id number] Arrival date & time: 01/22/21  1233     History  No chief complaint on file.   Ethan Reed is a 71 y.o. male.  71 year old male with history of CVA, hypertension, kidney stones presents today for evaluation of sudden onset of right flank pain that radiates to his right lower quadrant with associated nausea and vomiting.  Patient reports pain came on suddenly around 10 AM.  He has had multiple episodes of vomiting since.  He denies fever, chills, dysuria, abdominal pain otherwise, shortness of breath.  Reports this pain is consistent with his previous episodes of kidney stones.  His last kidney stone required lithotripsy.  The history is provided by the patient. No language interpreter was used.      Home Medications Prior to Admission medications   Medication Sig Start Date End Date Taking? Authorizing Provider  aspirin EC 81 MG EC tablet Take 1 tablet (81 mg total) by mouth daily. 02/16/18   Mikhail, Nita Sells, DO  atorvastatin (LIPITOR) 40 MG tablet Take 1 tablet (40 mg total) by mouth daily at 6 PM. 02/15/18   Edsel Petrin, DO  HYDROcodone-acetaminophen (NORCO/VICODIN) 5-325 MG tablet Take 1 tablet by mouth every 4 (four) hours as needed for moderate pain. 07/11/20 07/11/21  Crista Elliot, MD  irbesartan (AVAPRO) 75 MG tablet Take 75 mg by mouth daily.    [provider]  irbesartan-hydrochlorothiazide (AVALIDE) 150-12.5 MG tablet Take 1 tablet by mouth daily. 04/30/20   [provider]  ondansetron (ZOFRAN ODT) 4 MG disintegrating tablet Take 1 tablet (4 mg total) by mouth every 8 (eight) hours as needed for nausea or vomiting. 06/25/20   Liberty Handy, PA-C  tamsulosin (FLOMAX) 0.4 MG CAPS capsule Take 1 capsule (0.4 mg total) by mouth daily. 07/11/20   Crista Elliot, MD  UNABLE TO FIND Med Name: Herbal tea    [provider]      Allergies    Oxycodone     Review of Systems   Review of Systems  Constitutional:  Negative for activity change, chills and fever.  Respiratory:  Negative for shortness of breath.   Cardiovascular:  Negative for chest pain.  Gastrointestinal:  Positive for abdominal pain (groin pain.  Radiating from right flank), nausea and vomiting.  Genitourinary:  Positive for flank pain. Negative for dysuria and testicular pain.  Neurological:  Negative for weakness and light-headedness.  All other systems reviewed and are negative.  Physical Exam Updated Vital Signs BP 135/88 (BP Location: Left Arm)    Pulse 87    Temp 98 F (36.7 C) (Oral)    Resp 16    SpO2 100%  Physical Exam Vitals and nursing note reviewed.  Constitutional:      General: He is not in acute distress.    Appearance: Normal appearance. He is not ill-appearing.  HENT:     Head: Normocephalic and atraumatic.     Nose: Nose normal.  Eyes:     Conjunctiva/sclera: Conjunctivae normal.  Cardiovascular:     Rate and Rhythm: Normal rate and regular rhythm.  Pulmonary:     Effort: Pulmonary effort is normal. No respiratory distress.     Breath sounds: Normal breath sounds. No wheezing.  Abdominal:     General: There is no distension.     Tenderness: There is no abdominal tenderness. There is right CVA tenderness. There is no left CVA tenderness or  guarding.  Musculoskeletal:        General: No deformity. Normal range of motion.     Cervical back: Normal range of motion.  Skin:    Findings: No rash.  Neurological:     Mental Status: He is alert.    ED Results / Procedures / Treatments   Labs (all labs ordered are listed, but only abnormal results are displayed) Labs Reviewed  CBC WITH DIFFERENTIAL/PLATELET - Abnormal; Notable for the following components:      Result Value   Neutro Abs 8.1 (*)    All other components within normal limits  COMPREHENSIVE METABOLIC PANEL  URINALYSIS, ROUTINE W REFLEX MICROSCOPIC    EKG None  Radiology No  results found.  Procedures Procedures    Medications Ordered in ED Medications  morphine 4 MG/ML injection 4 mg (has no administration in time range)  sodium chloride 0.9 % bolus 1,000 mL (has no administration in time range)  ondansetron (ZOFRAN-ODT) disintegrating tablet 4 mg (4 mg Oral Given 01/22/21 1254)    ED Course/ Medical Decision Making/ A&P                           Medical Decision Making  71 year old male with history of kidney stones presents today for evaluation of sudden onset of right flank pain that started at 10 AM this morning with associated nausea vomiting.  Patient on initial evaluation appears uncomfortable and is actively vomiting into the emesis bag.  Patient with low volume emesis.  Pain medicine ordered.  Awaiting labs and imaging.  Initial work-up with mild renal insufficiency at creatinine of 1.27.  CMP otherwise with glucose 157 otherwise unremarkable.  CBC without leukocytosis.  CT renal with evidence of 7 mm right renal stone just above UV junction with associated hydronephrosis and perinephric stranding.  Results discussed with patient.  Patient reports improvement in his pain following this pain medication.  Discussed importance of getting a urine sample to rule out infected renal stone.  Once UA obtained will discuss case with urology.  Still awaiting UA.  Case discussed with urology.  They recommend pain regimen, Flomax and follow-up in clinic.  If UA positive patient will be treated for UTI with Keflex and outpatient follow-up.  Patient on exam is comfortable and resting without acute distress.  Report significant improvement in pain without additional episodes of vomiting.  UA without signs of UTI.  Patient appropriate for discharge.  Return precautions discussed.  Patient voices understanding and is in agreement with plan.  Referral to urology given.  Final Clinical Impression(s) / ED Diagnoses Final diagnoses:  Right renal stone    Rx / DC  Orders ED Discharge Orders          Ordered    HYDROcodone-acetaminophen (NORCO/VICODIN) 5-325 MG tablet  Every 6 hours PRN        01/22/21 1531    ondansetron (ZOFRAN-ODT) 4 MG disintegrating tablet  Every 8 hours PRN        01/22/21 1531    tamsulosin (FLOMAX) 0.4 MG CAPS capsule  Daily        01/22/21 1531              Marita Kansas, PA-C 01/22/21 1553    Tegeler, Canary Brim, MD 01/23/21 [phone]

## 2021-01-22 NOTE — Discharge Instructions (Addendum)
You have a 7 mm kidney stone on the right side which is just above the entrance to your bladder.  He had significant improvement in your pain and nausea with medications in the emergency room.  You were given IV hydration in the emergency room as well.  I have discussed your case with urology and they recommend following up in clinic.  If your symptoms worsen please return to the emergency room including developing a fever, nausea vomiting to the point you are unable to keep any food or drink down despite taking Zofran.  You can use ibuprofen for pain control and reserve narcotic pain medication for severe breakthrough pain. Your urine did not show signs of urinary tract infection.

## 2021-01-22 NOTE — ED Triage Notes (Signed)
Pt reports R hip pain and groin pain x 2 hours with nausea and vomiting.  Hx of kidney stones and states it feels the same.

## 2021-01-22 NOTE — ED Provider Notes (Signed)
Emergency Medicine Provider Triage Evaluation Note  Ethan Reed , a 71 y.o. male  was evaluated in triage.  Pt complains of right flank pain and vomiting that started today.  Pain starts in the right flank and back and radiates into the groin.  He has had multiple episodes of vomiting with this and is worried he may be passing a kidney stone.  He has had kidney stones previously with similar symptoms most recently about a year ago..  Review of Systems  Positive: Flank pain, vomiting Negative: Fevers, dysuria, hematuria  Physical Exam  BP 135/88 (BP Location: Left Arm)    Pulse 87    Temp 98 F (36.7 C) (Oral)    Resp 16    SpO2 100%  Gen:   Awake, appears uncomfortable, actively vomiting Resp:  Normal effort  MSK:   Moves extremities without difficulty  Other:  Right flank tenderness  Medical Decision Making  Medically screening exam initiated at 12:49 PM.  Appropriate orders placed.  Vivi Ferns was informed that the remainder of the evaluation will be completed by another provider, this initial triage assessment does not replace that evaluation, and the importance of remaining in the ED until their evaluation is complete.     Jacqlyn Larsen, PA-C 01/22/21 1258    Dorie Rank, MD 01/22/21 [phone]

## 2021-01-25 ENCOUNTER — Emergency Department (HOSPITAL_COMMUNITY): Payer: Medicare Other

## 2021-01-25 ENCOUNTER — Encounter (HOSPITAL_COMMUNITY): Payer: Self-pay | Admitting: Emergency Medicine

## 2021-01-25 ENCOUNTER — Emergency Department (HOSPITAL_COMMUNITY)
Admission: EM | Admit: 2021-01-25 | Discharge: 2021-01-25 | Disposition: A | Discharge status: Home / Self Care | Payer: Medicare Other | Attending: Emergency Medicine | Admitting: Emergency Medicine

## 2021-01-25 ENCOUNTER — Other Ambulatory Visit: Payer: Self-pay

## 2021-01-25 DIAGNOSIS — Z79899 Other long term (current) drug therapy: Secondary | ICD-10-CM | POA: Insufficient documentation

## 2021-01-25 DIAGNOSIS — R0789 Other chest pain: Secondary | ICD-10-CM | POA: Diagnosis not present

## 2021-01-25 DIAGNOSIS — I10 Essential (primary) hypertension: Secondary | ICD-10-CM | POA: Diagnosis not present

## 2021-01-25 DIAGNOSIS — Z20822 Contact with and (suspected) exposure to covid-19: Secondary | ICD-10-CM | POA: Diagnosis not present

## 2021-01-25 DIAGNOSIS — R519 Headache, unspecified: Secondary | ICD-10-CM | POA: Insufficient documentation

## 2021-01-25 DIAGNOSIS — R42 Dizziness and giddiness: Secondary | ICD-10-CM | POA: Diagnosis not present

## 2021-01-25 DIAGNOSIS — Z7982 Long term (current) use of aspirin: Secondary | ICD-10-CM | POA: Diagnosis not present

## 2021-01-25 LAB — PROTIME-INR
INR: 0.9 (ref 0.8–1.2)
Prothrombin Time: 12.5 seconds (ref 11.4–15.2)

## 2021-01-25 LAB — CBC
HCT: 42.6 % (ref 39.0–52.0)
Hemoglobin: 14.8 g/dL (ref 13.0–17.0)
MCH: 31.5 pg (ref 26.0–34.0)
MCHC: 34.7 g/dL (ref 30.0–36.0)
MCV: 90.6 fL (ref 80.0–100.0)
Platelets: 175 10*3/uL (ref 150–400)
RBC: 4.7 MIL/uL (ref 4.22–5.81)
RDW: 12.6 % (ref 11.5–15.5)
WBC: 5.8 10*3/uL (ref 4.0–10.5)
nRBC: 0 % (ref 0.0–0.2)

## 2021-01-25 LAB — DIFFERENTIAL
Abs Immature Granulocytes: 0.02 10*3/uL (ref 0.00–0.07)
Basophils Absolute: 0 10*3/uL (ref 0.0–0.1)
Basophils Relative: 1 %
Eosinophils Absolute: 0.2 10*3/uL (ref 0.0–0.5)
Eosinophils Relative: 4 %
Immature Granulocytes: 0 %
Lymphocytes Relative: 30 %
Lymphs Abs: 1.7 10*3/uL (ref 0.7–4.0)
Monocytes Absolute: 0.4 10*3/uL (ref 0.1–1.0)
Monocytes Relative: 7 %
Neutro Abs: 3.4 10*3/uL (ref 1.7–7.7)
Neutrophils Relative %: 58 %

## 2021-01-25 LAB — I-STAT CHEM 8, ED
BUN: 20 mg/dL (ref 8–23)
Calcium, Ion: 1.11 mmol/L — ABNORMAL LOW (ref 1.15–1.40)
Chloride: 98 mmol/L (ref 98–111)
Creatinine, Ser: 1.1 mg/dL (ref 0.61–1.24)
Glucose, Bld: 183 mg/dL — ABNORMAL HIGH (ref 70–99)
HCT: 45 % (ref 39.0–52.0)
Hemoglobin: 15.3 g/dL (ref 13.0–17.0)
Potassium: 2.7 mmol/L — CL (ref 3.5–5.1)
Sodium: 139 mmol/L (ref 135–145)
TCO2: 27 mmol/L (ref 22–32)

## 2021-01-25 LAB — COMPREHENSIVE METABOLIC PANEL
ALT: 20 U/L (ref 0–44)
AST: 24 U/L (ref 15–41)
Albumin: 3.8 g/dL (ref 3.5–5.0)
Alkaline Phosphatase: 73 U/L (ref 38–126)
Anion gap: 12 (ref 5–15)
BUN: 18 mg/dL (ref 8–23)
CO2: 25 mmol/L (ref 22–32)
Calcium: 8.9 mg/dL (ref 8.9–10.3)
Chloride: 99 mmol/L (ref 98–111)
Creatinine, Ser: 1.22 mg/dL (ref 0.61–1.24)
GFR, Estimated: >60 mL/min (ref >60)
Glucose, Bld: 187 mg/dL — ABNORMAL HIGH (ref 70–99)
Potassium: 2.7 mmol/L — CL (ref 3.5–5.1)
Sodium: 136 mmol/L (ref 135–145)
Total Bilirubin: 1.2 mg/dL (ref 0.3–1.2)
Total Protein: 7 g/dL (ref 6.5–8.1)

## 2021-01-25 LAB — URINALYSIS, ROUTINE W REFLEX MICROSCOPIC
Bilirubin Urine: NEGATIVE
Glucose, UA: NEGATIVE mg/dL
Hgb urine dipstick: NEGATIVE
Ketones, ur: NEGATIVE mg/dL
Leukocytes,Ua: NEGATIVE
Nitrite: NEGATIVE
Protein, ur: 30 mg/dL — AB
Specific Gravity, Urine: 1.02 (ref 1.005–1.030)
pH: 6.5 (ref 5.0–8.0)

## 2021-01-25 LAB — RESP PANEL BY RT-PCR (FLU A&B, COVID) ARPGX2
Influenza A by PCR: NEGATIVE
Influenza B by PCR: NEGATIVE
SARS Coronavirus 2 by RT PCR: NEGATIVE

## 2021-01-25 LAB — ETHANOL: Alcohol, Ethyl (B): <10 mg/dL (ref <10)

## 2021-01-25 LAB — RAPID URINE DRUG SCREEN, HOSP PERFORMED
Amphetamines: NOT DETECTED
Barbiturates: NOT DETECTED
Benzodiazepines: NOT DETECTED
Cocaine: NOT DETECTED
Opiates: NOT DETECTED
Tetrahydrocannabinol: NOT DETECTED

## 2021-01-25 LAB — CBG MONITORING, ED: Glucose-Capillary: 179 mg/dL — ABNORMAL HIGH (ref 70–99)

## 2021-01-25 LAB — URINALYSIS, MICROSCOPIC (REFLEX): Squamous Epithelial / HPF: NONE SEEN (ref 0–5)

## 2021-01-25 LAB — TROPONIN I (HIGH SENSITIVITY)
Troponin I (High Sensitivity): 18 ng/L — ABNORMAL HIGH (ref <18)
Troponin I (High Sensitivity): 126 ng/L (ref <18)
Troponin I (High Sensitivity): 129 ng/L (ref <18)

## 2021-01-25 LAB — APTT: aPTT: 24 seconds (ref 24–36)

## 2021-01-25 MED ORDER — POTASSIUM CHLORIDE 10 MEQ/100ML IV SOLN
10.0000 meq | Freq: Once | INTRAVENOUS | Status: AC
  Administered 2021-01-25: 10 meq via INTRAVENOUS
  Filled 2021-01-25: qty 100

## 2021-01-25 MED ORDER — DIAZEPAM 5 MG/ML IJ SOLN
2.5000 mg | Freq: Once | INTRAMUSCULAR | Status: AC
  Administered 2021-01-25: 2.5 mg via INTRAVENOUS
  Filled 2021-01-25: qty 2

## 2021-01-25 MED ORDER — IOHEXOL 350 MG/ML SOLN
75.0000 mL | Freq: Once | INTRAVENOUS | Status: AC | PRN
  Administered 2021-01-25: 75 mL via INTRAVENOUS

## 2021-01-25 MED ORDER — SODIUM CHLORIDE 0.9 % IV BOLUS
1000.0000 mL | Freq: Once | INTRAVENOUS | Status: AC
  Administered 2021-01-25: 1000 mL via INTRAVENOUS

## 2021-01-25 NOTE — ED Notes (Signed)
Patient in MRI 

## 2021-01-25 NOTE — ED Notes (Addendum)
Pt woken around 3 am with "pounding headache" and had urinated himself, clammy, and weak. Pt reports he was not wearing cpap tonight.  Recently seen for kidney stones. Pt presenting as extremely tired, falling asleep during conversation. Able to be awakened without difficulty.

## 2021-01-25 NOTE — ED Provider Notes (Signed)
Emergency Medicine Provider Triage Evaluation Note  Ethan Reed , a 71 y.o. male  was evaluated in triage.  Pt complains of sudden onset headache.  This started a 3 AM and woke him up.  He urinated.  He reports he feels like this is similar to previous strokes.  He has been vomiting.  His entire head hurts.  He does note that he feels maybe a little shortness of breath but globally weak with entire head pain.  He does not take any thinners, denies any trauma.  Review of Systems  Positive: Headache, vomiting Negative:   Physical Exam  BP (!) 165/98 (BP Location: Left Arm)    Pulse 95    Temp 97.7 F (36.5 C) (Oral)    Resp 17    Ht 6' (1.829 m)    Wt 95 kg    SpO2 92%    BMI 28.40 kg/m  Gen:   Awake, vomiting, appears unwell Resp:  Normal effort  MSK:   Slow to move extremities Other:  Patient is awake and alert.  His vomiting.  He is slow to move extremities.  He is able to slowly answer questions.  Neuro testing limited due to vomiting  Medical Decision Making  Medically screening exam initiated at 4:11 AM.  Appropriate orders placed.  Ethan Reed was informed that the remainder of the evaluation will be completed by another provider, this initial triage assessment does not replace that evaluation, and the importance of remaining in the ED until their evaluation is complete.  Patient here with sudden onset severe headache and vomiting.  He is hypertensive, I am concerned that he may have either intracranial hemorrhage based on the degree of his previously.  He does have some mild shortness of breath additionally. I called charge nurse for concerns about this patient.  I called CT scan, they state we can take patient to CT 2. I did touch base with neurology.  Patient does not meet criteria for activation currently as he does not have anything focal, however with his symptoms I am concerned he is intracranial hemorrhage or other abnormality. Patient will be taken to CT scanner  2.   Ethan Gong, PA-C 01/25/21 0413    Ethan Conn, MD 01/27/21 [phone]

## 2021-01-25 NOTE — Discharge Instructions (Signed)
You are seen in the emergency department for dizziness headache some shortness of breath.  Vomiting.  Your symptoms improved while you were here.  You had CAT scan of your head along with an MRI that did not show any evidence of stroke.  Your heart enzymes are mildly elevated although your symptoms have improved.  Please follow-up with your primary care doctor.  Return to the emergency department if any worsening or concerning symptoms.

## 2021-01-25 NOTE — ED Triage Notes (Signed)
Patient woke up this morning with headache , dizziness/lightheaded and nausea .

## 2021-01-25 NOTE — ED Provider Notes (Signed)
[facility] EMERGENCY DEPARTMENT Provider Note  CSN: [id number] Arrival date & time: 01/25/21 [id number]  Chief Complaint(s) Headache /Lightheaded  HPI Ethan Reed is a 71 y.o. male with a past medical history listed below including hypertension and prior ischemic stroke who presents to the emergency department with sudden onset generalized headache which is now subsided associated with dizziness which has been persistent since onset at 3 AM.  This awoke the patient up from sleep.  During that time patient also had bladder incontinence.  No seizure-like activity noted by significant other.  Due to the dizziness patient has had nonbloody nonbilious emesis.  Also reported mild chest discomfort, shortness of breath, and generalized fatigue.  He denies any recent fevers or infections.  No coughing or congestion.  Prior to this he has been in his normal state of health.   HPI  Past Medical History Past Medical History:  Diagnosis Date   CVA (cerebral vascular accident) ([place])    Dysrhythmia    Hypertension    Kidney stone    Medical history non-contributory    OSA on CPAP    Patient Active Problem List   Diagnosis Date Noted   Acute arterial ischemic stroke, vertebrobasilar, thalamic, left ([place]) 05/22/2018   Snoring 05/22/2018   Stroke ([place]) 04/02/2018   Hyperlipidemia 04/02/2018   Somnolence, daytime 04/02/2018   TIA (transient ischemic attack) 02/14/2018   Essential hypertension 02/14/2018   Home Medication(s) Prior to Admission medications   Medication Sig Start Date End Date Taking? Authorizing Provider  aspirin EC 81 MG EC tablet Take 1 tablet (81 mg total) by mouth daily. 02/16/18  Yes Mikhail, Velta Addison, DO  atorvastatin (LIPITOR) 40 MG tablet Take 1 tablet (40 mg total) by mouth daily at 6 PM. 02/15/18  Yes Mikhail, Velta Addison, DO  irbesartan (AVAPRO) 75 MG tablet Take 75 mg by mouth daily.   Yes [provider]  tamsulosin (FLOMAX) 0.4 MG CAPS capsule  Take 1 capsule (0.4 mg total) by mouth daily. 01/22/21  Yes Ali, Amjad, PA-C  UNABLE TO FIND Med Name: Herbal tea   Yes [provider]  HYDROcodone-acetaminophen (NORCO/VICODIN) 5-325 MG tablet Take 2 tablets by mouth every 6 (six) hours as needed for up to 12 doses for severe pain. 01/22/21   Evlyn Courier, PA-C  irbesartan-hydrochlorothiazide (AVALIDE) 150-12.5 MG tablet Take 1 tablet by mouth daily. Patient not taking: Reported on 01/25/2021 04/30/20   [provider]  ondansetron (ZOFRAN-ODT) 4 MG disintegrating tablet Take 1 tablet (4 mg total) by mouth every 8 (eight) hours as needed for nausea or vomiting. 01/22/21   Evlyn Courier, PA-C                                                                                                                                    Allergies Oxycodone  Review of Systems Review of Systems As noted in HPI  Physical Exam Vital Signs  I have reviewed the triage vital signs BP (!) 146/95    Pulse 82    Temp 97.7 F (36.5 C) (Oral)    Resp 20    Ht 6' (1.829 m)    Wt 95 kg    SpO2 94%    BMI 28.40 kg/m   Physical Exam Vitals reviewed.  Constitutional:      General: He is not in acute distress.    Appearance: He is well-developed. He is not diaphoretic.  HENT:     Head: Normocephalic and atraumatic.     Nose: Nose normal.  Eyes:     General: No scleral icterus.       Right eye: No discharge.        Left eye: No discharge.     Conjunctiva/sclera: Conjunctivae normal.     Pupils: Pupils are equal, round, and reactive to light.  Cardiovascular:     Rate and Rhythm: Normal rate and regular rhythm.     Heart sounds: No murmur heard.   No friction rub. No gallop.  Pulmonary:     Effort: Pulmonary effort is normal. No respiratory distress.     Breath sounds: Normal breath sounds. No stridor. No rales.  Abdominal:     General: There is no distension.     Palpations: Abdomen is soft.     Tenderness: There is no abdominal tenderness.   Musculoskeletal:        General: No tenderness.     Cervical back: Normal range of motion and neck supple.  Skin:    General: Skin is warm and dry.     Findings: No erythema or rash.  Neurological:     Mental Status: He is alert and oriented to person, place, and time.     Comments: Mental Status:  Alert and oriented to person, place, and time.  Attention and concentration normal.  Speech clear.  Recent memory is intact  Cranial Nerves:  II Visual Fields: Intact to confrontation. Visual fields intact. III, IV, VI: Pupils equal and reactive to light and near. Full eye movement without nystagmus  V Facial Sensation: Normal. No weakness of masticatory muscles  VII: No facial weakness or asymmetry  VIII Auditory Acuity: Grossly normal  IX/X: The uvula is midline; the palate elevates symmetrically  XI: Normal sternocleidomastoid and trapezius strength  XII: The tongue is midline. No atrophy or fasciculations.   Motor System: Muscle Strength: 5/5 and symmetric in the upper and lower extremities. No pronation or drift.  Muscle Tone: Tone and muscle bulk are normal in the upper and lower extremities.  Coordination: Intact finger-to-nose, No tremor.  Sensation: Intact to light touch Gait: Deferred  HINTS Plus: Nystagmus: none  Head impulse: not performed Skew: abnormal Hearing: intact     ED Results and Treatments Labs (all labs ordered are listed, but only abnormal results are displayed) Labs Reviewed  COMPREHENSIVE METABOLIC PANEL - Abnormal; Notable for the following components:      Result Value   Potassium 2.7 (*)    Glucose, Bld 187 (*)    All other components within normal limits  CBG MONITORING, ED - Abnormal; Notable for the following components:   Glucose-Capillary 179 (*)    All other components within normal limits  I-STAT CHEM 8, ED - Abnormal; Notable for the following components:   Potassium 2.7 (*)    Glucose, Bld 183 (*)    Calcium, Ion 1.11 (*)    All  other components within normal limits  TROPONIN I (HIGH SENSITIVITY) - Abnormal; Notable for the following components:   Troponin I (High Sensitivity) 18 (*)    All other components within normal limits  RESP PANEL BY RT-PCR (FLU A&B, COVID) ARPGX2  ETHANOL  PROTIME-INR  APTT  CBC  DIFFERENTIAL  RAPID URINE DRUG SCREEN, HOSP PERFORMED  URINALYSIS, ROUTINE W REFLEX MICROSCOPIC  TROPONIN I (HIGH SENSITIVITY)                                                                                                                         EKG  EKG Interpretation  Date/Time:  Wednesday January 25 2021 03:42:48 EST Ventricular Rate:  94 PR Interval:  194 QRS Duration: 106 QT Interval:  378 QTC Calculation: 472 R Axis:   87 Text Interpretation: Normal sinus rhythm Junctional ST depression, probably normal Borderline ECG No acute changes When compared with ECG of 14-Feb-2018 14:55, PREVIOUS ECG IS PRESENT Confirmed by Addison Lank [phone]) on 01/25/2021 4:46:08 AM       Radiology CT HEAD WO CONTRAST (5MM)  Result Date: 01/25/2021 CLINICAL DATA:  Headache, sudden, severe. EXAM: CT HEAD WITHOUT CONTRAST TECHNIQUE: Contiguous axial images were obtained from the base of the skull through the vertex without intravenous contrast. COMPARISON:  02/14/2018. FINDINGS: Brain: No acute intracranial hemorrhage, midline shift or mass effect. No extra-axial fluid collection. Gray-white matter differentiation is within normal limits and there is no hydrocephalus. A hypodensity is noted in the thalamus on the left. Vascular: Atherosclerotic calcification of the carotid siphons. No hyperdense vessel. Skull: Normal. Negative for fracture or focal lesion. Sinuses/Orbits: No acute finding. Other: None. IMPRESSION: 1. No acute intracranial hemorrhage. 2. Old lacunar infarct in the thalamus on the left. . Electronically Signed   By: Brett Fairy M.D.   On: 01/25/2021 04:32   DG Chest Port 1 View  Result Date:  01/25/2021 CLINICAL DATA:  71 year old male woke at 0300 hours with severe headache, weakness, incontinence. EXAM: PORTABLE CHEST 1 VIEW COMPARISON:  Chest radiographs 02/14/2018. FINDINGS: Portable AP view at 0519 hours. Stable somewhat large lung volumes. Mediastinal contours are stable with mildly tortuous thoracic aorta. Visualized tracheal air column is within normal limits. Allowing for portable technique the lungs are clear. No pneumothorax or pleural effusion. No acute osseous abnormality identified. IMPRESSION: No acute cardiopulmonary abnormality. Electronically Signed   By: Genevie Ann M.D.   On: 01/25/2021 05:40    Pertinent labs & imaging results that were available during my care of the patient were reviewed by me and considered in my medical decision making (see MDM for details).  Medications Ordered in ED Medications  potassium chloride 10 mEq in 100 mL IVPB (10 mEq Intravenous New Bag/Given 01/25/21 0628)  sodium chloride 0.9 % bolus 1,000 mL (1,000 mLs Intravenous New Bag/Given 01/25/21 0547)  diazepam (VALIUM) injection 2.5 mg (2.5 mg Intravenous Given 01/25/21 0629)  Procedures Procedures  (including critical care time)  Medical Decision Making / ED Course   Headache with dizziness. Sudden onset. Headache resolved. Dizziness persists Abnormal test of skew. No other focal deficits. Labs interpreted by me: CBC without leukocytosis or anemia Metabolic panel notable for hypokalemia.  No other significant electrolyte derangements or renal sufficiency. Repleted with IV potassium CT head interpreted by me: No evidence of ICH. Radiology agreed and also noted remote thalamic infarct. Given the abnormal test of skew with persistent dizziness we will order an MRI to rule out new stroke. In the interim we will provide patient with low-dose of Ativan. Will  give IV fluids.  Chest discomfort Atypical for ACS. EKG without acute ischemic changes or evidence of pericarditis.  Will obtain serial troponins. Low suspicion for pulmonary embolism. Presentation not classic for arctic dissection or esophageal perforation. On my read of the chest x-ray, there was no evidence suggestive of pneumonia, pneumothorax, pneumomediastinum, pulmonary edema concerning for new or exacerbation of heart failure, abnormal contour of the mediastinum to suggest dissection, and no evidence of acute injuries.   Patient care turned over to oncoming provider. Patient case and results discussed in detail; please see their note for further ED managment.     Final Clinical Impression(s) / ED Diagnoses Final diagnoses:  Chest discomfort  Dizziness           This chart was dictated using voice recognition software.  Despite best efforts to proofread,  errors can occur which can change the documentation meaning.    Fatima Blank, MD 01/25/21 [phone]

## 2021-01-25 NOTE — ED Provider Notes (Signed)
Signout from Dr. Leonette Monarch.  71 year old male prior history of stroke here with sudden headache dizziness nausea vomiting chest discomfort.  Patient is pending MRI brain.  Delta troponin.  Disposition per results of testing. Physical Exam  BP (!) 135/91    Pulse 72    Temp 97.7 F (36.5 C) (Oral)    Resp 19    Ht 6' (1.829 m)    Wt 95 kg    SpO2 95%    BMI 28.40 kg/m   Physical Exam  ED Course/Procedures   Clinical Course as of 01/25/21 0734  Wed Jan 25, 2021  0408 I called cahrge, concern that patient may have intracranial hemorrhage given sudden onset and severity of headache and vomiting.  Patient will go to CT scan then to room.  [EH]    Clinical Course User Index [EH] Lorin Glass, PA-C    Procedures  MDM  Patient's MRI does not show any acute findings.  His troponin is rising.  Patient interviewed by me.  He said he forgot to put his CPAP on last night.  Woke up to a loud noise in his head.  Turned to get out of bed and became acutely dizzy associated with nausea and diaphoresis.  Short of breath but no chest pain.  Headache resolved and dizziness improved.  Feels that he had vertigo in the past and feels similar to that.  Due to his neuro symptoms and rising troponin we will get a chest CT.  Patient's dizziness is resolved.  He has had no further chest pain or shortness of breath.  He is hungry and would like some lunch.  Does say he had vertigo in the past.  Reviewed EKG and story with Dr. Angelena Form.  He does not feel he needs to be admitted for further ischemic work-up.       Hayden Rasmussen, MD 01/25/21 1700

## 2021-01-25 NOTE — ED Notes (Signed)
Taken to MRI by transporter.  °

## 2021-02-14 ENCOUNTER — Other Ambulatory Visit: Payer: Self-pay | Admitting: Urology

## 2021-02-20 NOTE — Progress Notes (Signed)
Talked with patient. Verbalized understanding of instructions. Hx and meds reviewed. Arrival time 0800 cl liquids until 0600 can take B/p meds morning of. Wife is the driver. Pt states stopped ASA on Sunday.

## 2021-02-22 NOTE — H&P (Signed)
H&P  Chief Complaint: Kidney stone  History of Present Illness: 71 yo male presents for ESL for mgmt of a Rt lower ureteral stone--9 mm.  Past Medical History:  Diagnosis Date   CVA (cerebral vascular accident) ([place])    Dysrhythmia    Hypertension    Kidney stone    Medical history non-contributory    OSA on CPAP     Past Surgical History:  Procedure Laterality Date   EXTRACORPOREAL SHOCK WAVE LITHOTRIPSY Right 07/11/2020   Procedure: RIGHT EXTRACORPOREAL SHOCK WAVE LITHOTRIPSY (ESWL);  Surgeon: Lucas Mallow, MD;  Location: [facility];  Service: Urology;  Laterality: Right;    Home Medications:  Allergies as of 02/22/2021       Reactions   Oxycodone Nausea And Vomiting, Other (See Comments)   Dizziness        Medication List      Notice   Cannot display discharge medications because the patient has not yet been admitted.     Allergies:  Allergies  Allergen Reactions   Oxycodone Nausea And Vomiting and Other (See Comments)    Dizziness    Family History  Problem Relation Age of Onset   Heart attack Mother    Lung cancer Father    Cancer Sister     Social History:  reports that he has never smoked. He has never used smokeless tobacco. He reports that he does not drink alcohol and does not use drugs.  ROS: A complete review of systems was performed.  All systems are negative except for pertinent findings as noted.  Physical Exam:  Vital signs in last 24 hours: There were no vitals taken for this visit. Constitutional:  Alert and oriented, No acute distress Cardiovascular: Regular rate  Respiratory: Normal respiratory effort Neurologic: Grossly intact, no focal deficits Psychiatric: Normal mood and affect  I have reviewed prior pt notes  I have reviewed urinalysis results  I have independently reviewed prior imaging    Impression/Assessment:  Rt lower ureteral stone  Plan:  ESL--this is the first stage of a possibly multi  staged treatment plan.

## 2021-02-23 ENCOUNTER — Ambulatory Visit (HOSPITAL_BASED_OUTPATIENT_CLINIC_OR_DEPARTMENT_OTHER)
Admission: RE | Admit: 2021-02-23 | Discharge: 2021-02-23 | Disposition: A | Discharge status: Home / Self Care | Payer: Medicare Other | Attending: Urology | Admitting: Urology

## 2021-02-23 ENCOUNTER — Encounter (HOSPITAL_BASED_OUTPATIENT_CLINIC_OR_DEPARTMENT_OTHER): Payer: Self-pay | Admitting: Urology

## 2021-02-23 ENCOUNTER — Other Ambulatory Visit: Payer: Self-pay

## 2021-02-23 ENCOUNTER — Encounter (HOSPITAL_BASED_OUTPATIENT_CLINIC_OR_DEPARTMENT_OTHER)
Admission: RE | Disposition: A | Discharge status: Home / Self Care | Payer: Self-pay | Source: Home / Self Care | Attending: Urology

## 2021-02-23 ENCOUNTER — Ambulatory Visit (HOSPITAL_COMMUNITY): Payer: Medicare Other

## 2021-02-23 DIAGNOSIS — N201 Calculus of ureter: Secondary | ICD-10-CM | POA: Insufficient documentation

## 2021-02-23 DIAGNOSIS — G4733 Obstructive sleep apnea (adult) (pediatric): Secondary | ICD-10-CM | POA: Insufficient documentation

## 2021-02-23 DIAGNOSIS — Z8673 Personal history of transient ischemic attack (TIA), and cerebral infarction without residual deficits: Secondary | ICD-10-CM | POA: Insufficient documentation

## 2021-02-23 DIAGNOSIS — I10 Essential (primary) hypertension: Secondary | ICD-10-CM | POA: Diagnosis not present

## 2021-02-23 HISTORY — PX: EXTRACORPOREAL SHOCK WAVE LITHOTRIPSY: SHX1557

## 2021-02-23 SURGERY — LITHOTRIPSY, ESWL
Anesthesia: LOCAL | Laterality: Right

## 2021-02-23 MED ORDER — DIPHENHYDRAMINE HCL 25 MG PO CAPS
ORAL_CAPSULE | ORAL | Status: AC
  Filled 2021-02-23: qty 1

## 2021-02-23 MED ORDER — KETOROLAC TROMETHAMINE 30 MG/ML IJ SOLN: 15.0000 mg | Freq: Once | INTRAMUSCULAR | Status: DC

## 2021-02-23 MED ORDER — CIPROFLOXACIN HCL 500 MG PO TABS
ORAL_TABLET | ORAL | Status: AC
  Filled 2021-02-23: qty 1

## 2021-02-23 MED ORDER — DIAZEPAM 5 MG PO TABS
ORAL_TABLET | ORAL | Status: AC
  Filled 2021-02-23: qty 2

## 2021-02-23 MED ORDER — DIAZEPAM 5 MG PO TABS
10.0000 mg | ORAL_TABLET | ORAL | Status: AC
  Administered 2021-02-23: 10 mg via ORAL

## 2021-02-23 MED ORDER — SODIUM CHLORIDE 0.9 % IV SOLN: INTRAVENOUS | Status: DC

## 2021-02-23 MED ORDER — CIPROFLOXACIN HCL 500 MG PO TABS
500.0000 mg | ORAL_TABLET | ORAL | Status: AC
  Administered 2021-02-23: 500 mg via ORAL

## 2021-02-23 MED ORDER — DIPHENHYDRAMINE HCL 25 MG PO CAPS
25.0000 mg | ORAL_CAPSULE | ORAL | Status: AC
  Administered 2021-02-23: 25 mg via ORAL

## 2021-02-23 MED ORDER — KETOROLAC TROMETHAMINE 30 MG/ML IJ SOLN: 30.0000 mg | Freq: Once | INTRAMUSCULAR | Status: DC

## 2021-02-23 NOTE — Discharge Instructions (Signed)
See [facility] discharge instructions in chart.  

## 2021-02-23 NOTE — Progress Notes (Signed)
Red splotchy area right lower abd from litho entry site.

## 2021-02-23 NOTE — Interval H&P Note (Signed)
History and Physical Interval Note:  02/23/2021 10:23 AM  [name]  has presented today for surgery, with the diagnosis of RIGHT URETERAL STONE.  The various methods of treatment have been discussed with the patient and family. After consideration of risks, benefits and other options for treatment, the patient has consented to  Procedure(s): EXTRACORPOREAL SHOCK WAVE LITHOTRIPSY (ESWL) (Right) as a surgical intervention.  The patient's history has been reviewed, patient examined, no change in status, stable for surgery.  I have reviewed the patient's chart and labs.  Questions were answered to the patient's satisfaction.     Bertram Millard Caitlin Hillmer

## 2021-02-23 NOTE — Op Note (Addendum)
See [facility] OP note scanned into chart. 

## 2021-02-24 ENCOUNTER — Encounter (HOSPITAL_BASED_OUTPATIENT_CLINIC_OR_DEPARTMENT_OTHER): Payer: Self-pay | Admitting: Urology

## 2021-03-09 ENCOUNTER — Encounter: Payer: Self-pay | Admitting: Adult Health

## 2021-03-09 ENCOUNTER — Ambulatory Visit (INDEPENDENT_AMBULATORY_CARE_PROVIDER_SITE_OTHER): Payer: Medicare Other | Admitting: Adult Health

## 2021-03-09 VITALS — BP 121/77 | HR 87 | Ht 72.0 in | Wt 191.2 lb

## 2021-03-09 DIAGNOSIS — Z9989 Dependence on other enabling machines and devices: Secondary | ICD-10-CM

## 2021-03-09 DIAGNOSIS — G4733 Obstructive sleep apnea (adult) (pediatric): Secondary | ICD-10-CM | POA: Diagnosis not present

## 2021-03-09 NOTE — Patient Instructions (Signed)
Continue using CPAP nightly and greater than 4 hours each night °If your symptoms worsen or you develop new symptoms please let us know.  ° °

## 2021-03-09 NOTE — Progress Notes (Signed)
PATIENT: Ethan Reed DOB: 04-26-1950  REASON FOR VISIT: follow up HISTORY FROM: patient  HISTORY OF PRESENT ILLNESS: Today 03/09/21:  Mr. Gwynn is a 71 year old male with a history of obstructive sleep apnea on CPAP.  He returns today for follow-up.  He reports that the CPAP is working well for him.  .The patient states that he would like to be able to get off the machine eventually.  He is concerned about the accuracy of the home sleep test. reviewed his home sleep test with himHe denies any new issues.  His download is below    03-09-20: Mr. Hurry is a 71 year old male with a history of obstructive sleep apnea on CPAP.  He reports that the CPAP is working well for him.  He denies any new issues.  Returns today for follow-up.  Compliance Report Compliance Payor Standard Usage 02/08/2020 - 03/08/2020 Usage days 30/30 days (100%) >= 4 hours 27 days (90%) Average usage (days used) 6 hours 5 minutes  AirSense 10 AutoSet Serial number [id number] Mode AutoSet Min Pressure 5 cmH2O Max Pressure 11 cmH2O EPR Fulltime EPR level 2 Response Soft  Therapy Pressure - cmH2O Median: 5.9 95th percentile: 7.8 Maximum: 8.8 Leaks - L/min Median: 8.9 95th percentile: 30.4 Maximum: 42.0 Events per hour AI: 2.7 HI: 0.5 AHI: 3.2 Apnea Index Central: 1.9 Obstructive: 0.2 Unknown: 0.5  03/09/19: Mr. Caulley is a 71 year old male with a history of obstructive sleep apnea on CPAP.  He returns today for follow-up.  His download indicates that he uses machine 27 out of 30 days for compliance of 90%.  He uses machine greater than 4 hours 26 days for compliance of 87%.  On average he uses his machine 5 hours and 33 minutes.  His residual AHI is 1.5 on 5 to 11 cm of water with EPR of 2.  He does not have a significant leak.  He returns today for an evaluation.  HISTORY 09/04/18:   Mr. Birdwell is a 71 year old male with a history of obstructive sleep apnea.  He returns today for  follow-up.  His CPAP download indicates that he uses machine 24 out of 30 days for compliance of 80%.  He uses machine greater than 4 hours 17 days for compliance of 57%.  On average he uses his machine 5 hours and 37 minutes.  His residual AHI is 2.8 on 5 to 15 cm of water with EPR of 2.  His leak in the 95th percentile is 4.5.  He states that he started to see the benefit of using the CPAP but has not noticed a big change yet.  Denies any new issues.  Returns today for evaluation.  REVIEW OF SYSTEMS: Out of a complete 14 system review of symptoms, the patient complains only of the following symptoms, and all other reviewed systems are negative.  Epworth sleepiness score 7 ALLERGIES: Allergies  Allergen Reactions   Oxycodone Nausea And Vomiting and Other (See Comments)    Dizziness    HOME MEDICATIONS: Outpatient Medications Prior to Visit  Medication Sig Dispense Refill   aspirin EC 81 MG EC tablet Take 1 tablet (81 mg total) by mouth daily. 30 tablet 0   atorvastatin (LIPITOR) 40 MG tablet Take 1 tablet (40 mg total) by mouth daily at 6 PM. 30 tablet 0   HYDROcodone-acetaminophen (NORCO/VICODIN) 5-325 MG tablet Take 2 tablets by mouth every 6 (six) hours as needed for up to 12 doses for severe pain. 10 tablet 0  irbesartan (AVAPRO) 75 MG tablet Take 75 mg by mouth daily.     irbesartan-hydrochlorothiazide (AVALIDE) 150-12.5 MG tablet Take 1 tablet by mouth daily.     ondansetron (ZOFRAN-ODT) 4 MG disintegrating tablet Take 1 tablet (4 mg total) by mouth every 8 (eight) hours as needed for nausea or vomiting. 20 tablet 0   UNABLE TO FIND Med Name: Herbal tea     No facility-administered medications prior to visit.    PAST MEDICAL HISTORY: Past Medical History:  Diagnosis Date   CVA (cerebral vascular accident) ([facility])    Dysrhythmia    Hypertension    Kidney stone    Medical history non-contributory    OSA on CPAP     PAST SURGICAL HISTORY: Past Surgical History:  Procedure  Laterality Date   EXTRACORPOREAL SHOCK WAVE LITHOTRIPSY Right 07/11/2020   Procedure: RIGHT EXTRACORPOREAL SHOCK WAVE LITHOTRIPSY (ESWL);  Surgeon: Crista Elliot, MD;  Location: [facility];  Service: Urology;  Laterality: Right;   EXTRACORPOREAL SHOCK WAVE LITHOTRIPSY Right 02/23/2021   Procedure: EXTRACORPOREAL SHOCK WAVE LITHOTRIPSY (ESWL);  Surgeon: Marcine Matar, MD;  Location: [facility];  Service: Urology;  Laterality: Right;    FAMILY HISTORY: Family History  Problem Relation Age of Onset   Heart attack Mother    Lung cancer Father    Cancer Sister     SOCIAL HISTORY: Social History   Socioeconomic History   Marital status: Widowed    Spouse name: Not on file   Number of children: Not on file   Years of education: Not on file   Highest education level: Not on file  Occupational History   Not on file  Tobacco Use   Smoking status: Never   Smokeless tobacco: Never  Vaping Use   Vaping Use: Never used  Substance and Sexual Activity   Alcohol use: No   Drug use: No   Sexual activity: Yes  Other Topics Concern   Not on file  Social History Narrative   Live alone.  Retired Research officer, political party.  Children grown-3, one local.  Caffeine: non caffeine.  Education:  HS.     Social Determinants of Health   Financial Resource Strain: Not on file  Food Insecurity: Not on file  Transportation Needs: Not on file  Physical Activity: Not on file  Stress: Not on file  Social Connections: Not on file  Intimate Partner Violence: Not on file      PHYSICAL EXAM  Vitals:   03/09/21 1309  BP: 121/77  Pulse: 87  Weight: 191 lb 3.2 oz (86.7 kg)  Height: 6' (1.829 m)   Body mass index is 25.93 kg/m. Generalized: Well developed, in no acute distress  Chest: Lungs clear to auscultation bilaterally  Neurological examination  Mentation: Alert oriented to time, place, history taking. Follows all commands speech and language fluent Cranial  nerve II-XII: Extraocular movements were full, visual field were full on confrontational test Head turning and shoulder shrug  were normal and symmetric. Motor: The motor testing reveals 5 over 5 strength of all 4 extremities. Good symmetric motor tone is noted throughout.  Sensory: Sensory testing is intact to soft touch on all 4 extremities. No evidence of extinction is noted.  Gait and station: Gait is normal.    DIAGNOSTIC DATA (LABS, IMAGING, TESTING) - I reviewed patient records, labs, notes, testing and imaging myself where available.  Lab Results  Component Value Date   WBC 5.8 01/25/2021   HGB 15.3 01/25/2021  HCT 45.0 01/25/2021   MCV 90.6 01/25/2021   PLT 175 01/25/2021      Component Value Date/Time   NA 139 01/25/2021 0522   K 2.7 (LL) 01/25/2021 0522   CL 98 01/25/2021 0522   CO2 25 01/25/2021 0414   GLUCOSE 183 (H) 01/25/2021 0522   BUN 20 01/25/2021 0522   CREATININE 1.10 01/25/2021 0522   CALCIUM 8.9 01/25/2021 0414   PROT 7.0 01/25/2021 0414   ALBUMIN 3.8 01/25/2021 0414   AST 24 01/25/2021 0414   ALT 20 01/25/2021 0414   ALKPHOS 73 01/25/2021 0414   BILITOT 1.2 01/25/2021 0414   GFRNONAA >60 01/25/2021 0414   GFRAA >60 02/14/2018 1443   Lab Results  Component Value Date   CHOL 214 (H) 02/15/2018   HDL 34 (L) 02/15/2018   LDLCALC 150 (H) 02/15/2018   TRIG 148 02/15/2018   CHOLHDL 6.3 02/15/2018   Lab Results  Component Value Date   HGBA1C 5.8 (H) 02/15/2018      ASSESSMENT AND PLAN 71 y.o. year old male  has a past medical history of CVA (cerebral vascular accident) ([facility]), Dysrhythmia, Hypertension, Kidney stone, Medical history non-contributory, and OSA on CPAP. here with:  Obstructive sleep apnea on CPAP  The patient's CPAP download shows excellent compliance and good treatment of his apnea.  He is encouraged to continue using CPAP nightly and greater than 4 hours each night.  He is advised that if his symptoms worsen or he develops new  symptoms he should let us know.  We did discuss repeating home sleep test however he deferred for now.  He will follow-up in 1 year or sooner if needed   Butch Penny, MSN, NP-C 03/09/2021, 12:46 PM [facility] [address], [address] [place], [place] [address] [phone]

## 2022-03-14 NOTE — Progress Notes (Unsigned)
PATIENT: Ethan Reed DOB: 05/19/1950  REASON FOR VISIT: follow up HISTORY FROM: patient PRIMARY NEUROLOGIST: Dr. Brett Fairy  Chief Complaint  Patient presents with   Follow-up    Rm 4, alone.  CPAP..  c/o dry mouth and throat.     HISTORY OF PRESENT ILLNESS: Today 03/14/22:  Ethan Reed is a 72 y.o. male with a history of OSA on CPAP. Returns today for follow-up.  Reports a dry mouth and throat on occasion when he wakes up. Has never adjusted humidification. Finds it beneficial. DL is below      REVIEW OF SYSTEMS: Out of a complete 14 system review of symptoms, the patient complains only of the following symptoms, and all other reviewed systems are negative.  ESS 8  ALLERGIES: Allergies  Allergen Reactions   Oxycodone Nausea And Vomiting and Other (See Comments)    Dizziness    HOME MEDICATIONS: Outpatient Medications Prior to Visit  Medication Sig Dispense Refill   aspirin EC 81 MG EC tablet Take 1 tablet (81 mg total) by mouth daily. 30 tablet 0   atorvastatin (LIPITOR) 40 MG tablet Take 1 tablet (40 mg total) by mouth daily at 6 PM. 30 tablet 0   HYDROcodone-acetaminophen (NORCO/VICODIN) 5-325 MG tablet Take 2 tablets by mouth every 6 (six) hours as needed for up to 12 doses for severe pain. 10 tablet 0   irbesartan (AVAPRO) 75 MG tablet Take 75 mg by mouth daily.     irbesartan-hydrochlorothiazide (AVALIDE) 150-12.5 MG tablet Take 1 tablet by mouth daily.     ondansetron (ZOFRAN-ODT) 4 MG disintegrating tablet Take 1 tablet (4 mg total) by mouth every 8 (eight) hours as needed for nausea or vomiting. 20 tablet 0   UNABLE TO FIND Med Name: Herbal tea     No facility-administered medications prior to visit.    PAST MEDICAL HISTORY: Past Medical History:  Diagnosis Date   CVA (cerebral vascular accident) ([place])    Dysrhythmia    Hypertension    Kidney stone    Medical history non-contributory    OSA on CPAP     PAST SURGICAL HISTORY: Past  Surgical History:  Procedure Laterality Date   EXTRACORPOREAL SHOCK WAVE LITHOTRIPSY Right 07/11/2020   Procedure: RIGHT EXTRACORPOREAL SHOCK WAVE LITHOTRIPSY (ESWL);  Surgeon: Lucas Mallow, MD;  Location: [facility];  Service: Urology;  Laterality: Right;   EXTRACORPOREAL SHOCK WAVE LITHOTRIPSY Right 02/23/2021   Procedure: EXTRACORPOREAL SHOCK WAVE LITHOTRIPSY (ESWL);  Surgeon: Franchot Gallo, MD;  Location: [facility];  Service: Urology;  Laterality: Right;    FAMILY HISTORY: Family History  Problem Relation Age of Onset   Heart attack Mother    Lung cancer Father    Cancer Sister    Sleep apnea Neg Hx     SOCIAL HISTORY: Social History   Socioeconomic History   Marital status: Married    Spouse name: Not on file   Number of children: Not on file   Years of education: Not on file   Highest education level: Not on file  Occupational History   Not on file  Tobacco Use   Smoking status: Never   Smokeless tobacco: Never  Vaping Use   Vaping Use: Never used  Substance and Sexual Activity   Alcohol use: No   Drug use: No   Sexual activity: Yes  Other Topics Concern   Not on file  Social History Narrative   Live alone.  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  Children grown-3, one local.  Caffeine: non caffeine.  Education:  HS.     Social Determinants of Health   Financial Resource Strain: Not on file  Food Insecurity: Unknown (02/15/2018)   Hunger Vital Sign    Worried About Running Out of Food in the Last Year: Patient refused    [place] in the Last Year: Patient refused  Transportation Needs: Unknown (02/15/2018)   PRAPARE - Hydrologist (Medical): Patient refused    Lack of Transportation (Non-Medical): Patient refused  Physical Activity: Not on file  Stress: Not on file  Social Connections: Not on file  Intimate Partner Violence: Unknown (02/15/2018)   Humiliation, Afraid, Rape, and Kick questionnaire     Fear of Current or Ex-Partner: Patient refused    Emotionally Abused: Patient refused    Physically Abused: Patient refused    Sexually Abused: Patient refused      PHYSICAL EXAM  Vitals:   03/15/22 1311  BP: 137/73  Pulse: 84  Weight: 189 lb 6.4 oz (85.9 kg)  Height: 5' 11"$  (1.803 m)   Body mass index is 26.42 kg/m.  Generalized: Well developed, in no acute distress  Chest: Lungs clear to auscultation bilaterally  Neurological examination  Mentation: Alert oriented to time, place, history taking. Follows all commands speech and language fluent Cranial nerve II-XII: Facial symmetry noted Gait and station: Gait is normal.    DIAGNOSTIC DATA (LABS, IMAGING, TESTING) - I reviewed patient records, labs, notes, testing and imaging myself where available.  Lab Results  Component Value Date   WBC 5.8 01/25/2021   HGB 15.3 01/25/2021   HCT 45.0 01/25/2021   MCV 90.6 01/25/2021   PLT 175 01/25/2021      Component Value Date/Time   NA 139 01/25/2021 0522   K 2.7 (LL) 01/25/2021 0522   CL 98 01/25/2021 0522   CO2 25 01/25/2021 0414   GLUCOSE 183 (H) 01/25/2021 0522   BUN 20 01/25/2021 0522   CREATININE 1.10 01/25/2021 0522   CALCIUM 8.9 01/25/2021 0414   PROT 7.0 01/25/2021 0414   ALBUMIN 3.8 01/25/2021 0414   AST 24 01/25/2021 0414   ALT 20 01/25/2021 0414   ALKPHOS 73 01/25/2021 0414   BILITOT 1.2 01/25/2021 0414   GFRNONAA >60 01/25/2021 0414   GFRAA >60 02/14/2018 1443   Lab Results  Component Value Date   CHOL 214 (H) 02/15/2018   HDL 34 (L) 02/15/2018   LDLCALC 150 (H) 02/15/2018   TRIG 148 02/15/2018   CHOLHDL 6.3 02/15/2018   Lab Results  Component Value Date   HGBA1C 5.8 (H) 02/15/2018      ASSESSMENT AND PLAN 72 y.o. year old male  has a past medical history of CVA (cerebral vascular accident) ([place]), Dysrhythmia, Hypertension, Kidney stone, Medical history non-contributory, and OSA on CPAP. here with:  OSA on CPAP  - CPAP compliance  excellent - Good treatment of AHI  - Encourage patient to use CPAP nightly and > 4 hours each night - F/U in 1 year or sooner if needed    Ward Givens, MSN, NP-C 03/14/2022, 4:15 PM [facility] [address], [place], [place] [address] [phone]

## 2022-03-15 ENCOUNTER — Ambulatory Visit (INDEPENDENT_AMBULATORY_CARE_PROVIDER_SITE_OTHER): Payer: Medicare Other | Admitting: Adult Health

## 2022-03-15 ENCOUNTER — Encounter: Payer: Self-pay | Admitting: Adult Health

## 2022-03-15 VITALS — BP 137/73 | HR 84 | Ht 71.0 in | Wt 189.4 lb

## 2022-03-15 DIAGNOSIS — G4733 Obstructive sleep apnea (adult) (pediatric): Secondary | ICD-10-CM | POA: Diagnosis not present

## 2022-03-15 NOTE — Patient Instructions (Signed)
Continue using CPAP nightly and greater than 4 hours each night Adjust humidification If your symptoms worsen or you develop new symptoms please let us know.

## 2022-04-25 ENCOUNTER — Other Ambulatory Visit: Payer: Self-pay | Admitting: Sports Medicine

## 2022-04-25 ENCOUNTER — Ambulatory Visit
Admission: RE | Admit: 2022-04-25 | Discharge: 2022-04-25 | Disposition: A | Discharge status: Home / Self Care | Payer: Medicare Other | Source: Ambulatory Visit | Attending: Sports Medicine | Admitting: Sports Medicine

## 2022-04-25 DIAGNOSIS — M25521 Pain in right elbow: Secondary | ICD-10-CM

## 2022-07-02 ENCOUNTER — Emergency Department (HOSPITAL_COMMUNITY): Payer: Medicare Other

## 2022-07-02 ENCOUNTER — Other Ambulatory Visit: Payer: Self-pay

## 2022-07-02 ENCOUNTER — Emergency Department (HOSPITAL_COMMUNITY)
Admission: EM | Admit: 2022-07-02 | Discharge: 2022-07-02 | Disposition: A | Discharge status: Home / Self Care | Payer: Medicare Other | Attending: Emergency Medicine | Admitting: Emergency Medicine

## 2022-07-02 ENCOUNTER — Encounter (HOSPITAL_COMMUNITY): Payer: Self-pay | Admitting: *Deleted

## 2022-07-02 DIAGNOSIS — Z7982 Long term (current) use of aspirin: Secondary | ICD-10-CM | POA: Insufficient documentation

## 2022-07-02 DIAGNOSIS — R519 Headache, unspecified: Secondary | ICD-10-CM | POA: Insufficient documentation

## 2022-07-02 DIAGNOSIS — R531 Weakness: Secondary | ICD-10-CM | POA: Diagnosis not present

## 2022-07-02 DIAGNOSIS — R42 Dizziness and giddiness: Secondary | ICD-10-CM | POA: Diagnosis present

## 2022-07-02 DIAGNOSIS — Z79899 Other long term (current) drug therapy: Secondary | ICD-10-CM | POA: Diagnosis not present

## 2022-07-02 DIAGNOSIS — Z8673 Personal history of transient ischemic attack (TIA), and cerebral infarction without residual deficits: Secondary | ICD-10-CM | POA: Diagnosis not present

## 2022-07-02 DIAGNOSIS — I4510 Unspecified right bundle-branch block: Secondary | ICD-10-CM | POA: Diagnosis not present

## 2022-07-02 LAB — I-STAT CHEM 8, ED
BUN: 21 mg/dL (ref 8–23)
Calcium, Ion: 1.1 mmol/L — ABNORMAL LOW (ref 1.15–1.40)
Chloride: 99 mmol/L (ref 98–111)
Creatinine, Ser: 1.1 mg/dL (ref 0.61–1.24)
Glucose, Bld: 138 mg/dL — ABNORMAL HIGH (ref 70–99)
HCT: 46 % (ref 39.0–52.0)
Hemoglobin: 15.6 g/dL (ref 13.0–17.0)
Potassium: 3.3 mmol/L — ABNORMAL LOW (ref 3.5–5.1)
Sodium: 137 mmol/L (ref 135–145)
TCO2: 26 mmol/L (ref 22–32)

## 2022-07-02 LAB — DIFFERENTIAL
Abs Immature Granulocytes: 0.03 10*3/uL (ref 0.00–0.07)
Basophils Absolute: 0 10*3/uL (ref 0.0–0.1)
Basophils Relative: 1 %
Eosinophils Absolute: 0.1 10*3/uL (ref 0.0–0.5)
Eosinophils Relative: 1 %
Immature Granulocytes: 1 %
Lymphocytes Relative: 19 %
Lymphs Abs: 1 10*3/uL (ref 0.7–4.0)
Monocytes Absolute: 0.3 10*3/uL (ref 0.1–1.0)
Monocytes Relative: 6 %
Neutro Abs: 3.8 10*3/uL (ref 1.7–7.7)
Neutrophils Relative %: 72 %

## 2022-07-02 LAB — COMPREHENSIVE METABOLIC PANEL
ALT: 19 U/L (ref 0–44)
AST: 21 U/L (ref 15–41)
Albumin: 3.9 g/dL (ref 3.5–5.0)
Alkaline Phosphatase: 69 U/L (ref 38–126)
Anion gap: 11 (ref 5–15)
BUN: 20 mg/dL (ref 8–23)
CO2: 24 mmol/L (ref 22–32)
Calcium: 9.2 mg/dL (ref 8.9–10.3)
Chloride: 99 mmol/L (ref 98–111)
Creatinine, Ser: 1.09 mg/dL (ref 0.61–1.24)
GFR, Estimated: >60 mL/min (ref >60)
Glucose, Bld: 137 mg/dL — ABNORMAL HIGH (ref 70–99)
Potassium: 3.3 mmol/L — ABNORMAL LOW (ref 3.5–5.1)
Sodium: 134 mmol/L — ABNORMAL LOW (ref 135–145)
Total Bilirubin: 0.9 mg/dL (ref 0.3–1.2)
Total Protein: 7.3 g/dL (ref 6.5–8.1)

## 2022-07-02 LAB — CBC
HCT: 45.8 % (ref 39.0–52.0)
Hemoglobin: 15.8 g/dL (ref 13.0–17.0)
MCH: 32 pg (ref 26.0–34.0)
MCHC: 34.5 g/dL (ref 30.0–36.0)
MCV: 92.7 fL (ref 80.0–100.0)
Platelets: 159 10*3/uL (ref 150–400)
RBC: 4.94 MIL/uL (ref 4.22–5.81)
RDW: 12.5 % (ref 11.5–15.5)
WBC: 5.2 10*3/uL (ref 4.0–10.5)
nRBC: 0 % (ref 0.0–0.2)

## 2022-07-02 LAB — PROTIME-INR
INR: 1 (ref 0.8–1.2)
Prothrombin Time: 13.3 seconds (ref 11.4–15.2)

## 2022-07-02 LAB — CBG MONITORING, ED: Glucose-Capillary: 154 mg/dL — ABNORMAL HIGH (ref 70–99)

## 2022-07-02 LAB — ETHANOL: Alcohol, Ethyl (B): <10 mg/dL (ref <10)

## 2022-07-02 LAB — APTT: aPTT: 27 seconds (ref 24–36)

## 2022-07-02 MED ORDER — POTASSIUM CHLORIDE CRYS ER 20 MEQ PO TBCR
40.0000 meq | EXTENDED_RELEASE_TABLET | ORAL | Status: AC
  Administered 2022-07-02: 40 meq via ORAL
  Filled 2022-07-02: qty 2

## 2022-07-02 MED ORDER — SODIUM CHLORIDE 0.9% FLUSH: 3.0000 mL | Freq: Once | INTRAVENOUS | Status: DC

## 2022-07-02 MED ORDER — MECLIZINE HCL 12.5 MG PO TABS: 12.5000 mg | ORAL_TABLET | Freq: Three times a day (TID) | ORAL | 0 refills | Status: AC | PRN

## 2022-07-02 NOTE — Discharge Instructions (Signed)
You were seen for your vertigo in the emergency department.   At home, please take the meclizine we have prescribed you as needed for your dizziness.    Continue to check your blood pressure during the day and talk to your primary doctor to see if her blood pressure medication needs to be increased.  Check your MyChart online for the results of any tests that had not resulted by the time you left the emergency department.   Follow-up with your primary doctor in 2-3 days regarding your visit.    Return immediately to the emergency department if you experience any of the following: Vertigo that lasts for more than several minutes, double vision, difficulty speaking, arm or leg weakness, or any other concerning symptoms.    Thank you for visiting our Emergency Department. It was a pleasure taking care of you today.

## 2022-07-02 NOTE — ED Triage Notes (Signed)
Patient states he was awakened at 0345 with dizziness and the room was spinning only lasted for several mins and it stopped approx. 30 mins later happened again lasted 1-2 mins  last time it happened was 0630 this am, states he is always laying down when it happens. Denies numbness  or weakness.

## 2022-07-02 NOTE — ED Provider Notes (Signed)
[place] EMERGENCY DEPARTMENT AT [facility] Provider Note   CSN: [id number] Arrival date & time: 07/02/22  7829     History {Add pertinent medical, surgical, social history, OB history to HPI:1} Chief Complaint  Patient presents with   Dizziness    Ethan Reed is a 72 y.o. male.  72 year old male with a history of stroke with residual right handed weakness who presents emergency department with vertigo.  At 3:45 in the morning he says that he started experiencing dizziness and felt like the room was spinning.  Lasted for about a minute and then he started to go back to sleep and when he turned his head he got dizzy her second time.  Happened a third time this morning lasting approximately a minute and then resolved.  No hearing loss, tinnitus, dysphagia, diplopia, dysarthria, or difficulty walking.  Says he does have a history of vertigo.  Denies any other new neurologic symptoms.       Home Medications Prior to Admission medications   Medication Sig Start Date End Date Taking? Authorizing Provider  aspirin EC 81 MG EC tablet Take 1 tablet (81 mg total) by mouth daily. 02/16/18   Mikhail, Nita Sells, DO  atorvastatin (LIPITOR) 40 MG tablet Take 1 tablet (40 mg total) by mouth daily at 6 PM. 02/15/18   Mikhail, Nita Sells, DO  irbesartan-hydrochlorothiazide (AVALIDE) 150-12.5 MG tablet Take 1 tablet by mouth daily. 04/30/20   [provider]      Allergies    Oxycodone    Review of Systems   Review of Systems  Physical Exam Updated Vital Signs BP (!) 137/91   Pulse 82   Temp 98.1 F (36.7 C)   Resp 15   Ht 6' (1.829 m)   Wt 86.2 kg   SpO2 96%   BMI 25.77 kg/m  Physical Exam Vitals and nursing note reviewed.  Constitutional:      General: He is not in acute distress.    Appearance: He is well-developed.  HENT:     Head: Normocephalic and atraumatic.     Right Ear: Tympanic membrane, ear canal and external ear normal.     Left Ear: Tympanic  membrane, ear canal and external ear normal.     Nose: Nose normal.  Eyes:     Extraocular Movements: Extraocular movements intact.     Conjunctiva/sclera: Conjunctivae normal.     Pupils: Pupils are equal, round, and reactive to light.  Cardiovascular:     Rate and Rhythm: Normal rate and regular rhythm.  Pulmonary:     Effort: Pulmonary effort is normal. No respiratory distress.  Musculoskeletal:     Cervical back: Normal range of motion and neck supple.     Right lower leg: No edema.     Left lower leg: No edema.  Skin:    General: Skin is warm and dry.  Neurological:     Mental Status: He is alert.     Comments: MENTAL STATUS: AAOx3 No aphasia CRANIAL NERVES: II: Pupils equal and reactive 4 mm BL, no RAPD, no VF deficits III, IV, VI: EOM intact, no gaze preference or deviation, no nystagmus. V: normal sensation to light touch in V1, V2, and V3 segments bilaterally VII: no facial weakness or asymmetry, no nasolabial fold flattening VIII: normal hearing to speech and finger friction IX, X: normal palatal elevation, no uvular deviation XI: 5/5 head turn and 5/5 shoulder shrug bilaterally XII: midline tongue protrusion MOTOR: 5/5 strength in R shoulder flexion,  elbow flexion and extension, and grip strength. 5/5 strength in L shoulder flexion, elbow flexion and extension, and grip strength.  5/5 strength in R hip and knee flexion, knee extension, ankle plantar and dorsiflexion. 5/5 strength in L hip and knee flexion, knee extension, ankle plantar and dorsiflexion. SENSORY: Normal sensation to light touch in all extremities COORD: Normal finger to nose and heel to shin, no tremor, no dysmetria STATION: normal stance, no truncal ataxia GAIT: Normal   Psychiatric:        Mood and Affect: Mood normal.        Behavior: Behavior normal.     ED Results / Procedures / Treatments   Labs (all labs ordered are listed, but only abnormal results are displayed) Labs Reviewed  I-STAT  CHEM 8, ED - Abnormal; Notable for the following components:      Result Value   Potassium 3.3 (*)    Glucose, Bld 138 (*)    Calcium, Ion 1.10 (*)    All other components within normal limits  CBG MONITORING, ED - Abnormal; Notable for the following components:   Glucose-Capillary 154 (*)    All other components within normal limits  PROTIME-INR  APTT  CBC  DIFFERENTIAL  COMPREHENSIVE METABOLIC PANEL  ETHANOL    EKG EKG Interpretation  Date/Time:  Monday July 02 2022 07:39:07 EDT Ventricular Rate:  81 PR Interval:  194 QRS Duration: 110 QT Interval:  366 QTC Calculation: 425 R Axis:   86 Text Interpretation: Normal sinus rhythm Incomplete right bundle branch block Borderline ECG When compared with ECG of 25-Jan-2021 03:42, PREVIOUS ECG IS PRESENT Confirmed by Vonita Moss [phone]) on 07/02/2022 8:28:51 AM  Radiology No results found.  Procedures Procedures  {Document cardiac monitor, telemetry assessment procedure when appropriate:1}  Medications Ordered in ED Medications  sodium chloride flush (NS) 0.9 % injection 3 mL (has no administration in time range)    ED Course/ Medical Decision Making/ A&P   {   Click here for ABCD2, HEART and other calculatorsREFRESH Note before signing :1}                          Medical Decision Making Amount and/or Complexity of Data Reviewed Labs: ordered. Radiology: ordered.   Ethan Reed is a 72 y.o. male with comorbidities that complicate the patient evaluation including stroke with residual right hand weakness who presents to the emergency department with dizziness  Initial Ddx:  Peripheral vertigo, BPPV, TIA, stroke, arrhythmia  MDM:  Feel the patient likely has peripheral vertigo such as BPPV causing symptoms especially since it is caused by turning his head and only last for a minute at a time.  Has a nonfocal neuroexam feel that stroke is highly unlikely.  Considered TIA but for symptoms to recur like this  would be highly unlikely and seems to be more consistent with peripheral cause.  Plan:  ***  ED Summary/Re-evaluation:  ***  This patient presents to the ED for concern of complaints listed in HPI, this involves an extensive number of treatment options, and is a complaint that carries with it a high risk of complications and morbidity. Disposition including potential need for admission considered.   Dispo: {Disposition:28069}  Additional history obtained from {Additional History:28067} Records reviewed {Records Reviewed:28068} The following labs were independently interpreted: {labs interpreted:28064} and show {lab findings:28250} I independently reviewed the following imaging with scope of interpretation limited to determining acute life threatening conditions related to emergency  care: {imaging interpreted:28065} and agree with the radiologist interpretation with the following exceptions: none I personally reviewed and interpreted cardiac monitoring: {cardiac monitoring:28251} I personally reviewed and interpreted the pt's EKG: see above for interpretation  I have reviewed the patients home medications and made adjustments as needed Consults: {Consultants:28063} Social Determinants of health:  ***   {Document critical care time when appropriate:1} {Document review of labs and clinical decision tools ie heart score, Chads2Vasc2 etc:1}  {Document your independent review of radiology images, and any outside records:1} {Document your discussion with family members, caretakers, and with consultants:1} {Document social determinants of health affecting pt's care:1} {Document your decision making why or why not admission, treatments were needed:1} Final Clinical Impression(s) / ED Diagnoses Final diagnoses:  None    Rx / DC Orders ED Discharge Orders     None

## 2022-07-24 NOTE — Progress Notes (Signed)
It is part of the stroke orderset

## 2023-03-12 ENCOUNTER — Telehealth: Payer: Self-pay | Admitting: Adult Health

## 2023-03-12 NOTE — Telephone Encounter (Signed)
 LVM and sent mychart msg informing pt of need to reschedule 03/14/23 appt - office closing due to weather

## 2023-03-14 ENCOUNTER — Ambulatory Visit: Payer: Medicare Other | Admitting: Adult Health

## 2023-06-23 NOTE — Progress Notes (Unsigned)
 PATIENT: Ethan Reed DOB: October 16, 1950  REASON FOR VISIT: follow up HISTORY FROM: patient PRIMARY NEUROLOGIST: Dr. Albertina Hugger  No chief complaint on file.   HISTORY OF PRESENT ILLNESS: Today 06/23/23:  Ethan Reed is a 73 y.o. male with a history of ***. Returns today for follow-up.      03/15/22: Ethan Reed is a 73 y.o. male with a history of OSA on CPAP. Returns today for follow-up.  Reports a dry mouth and throat on occasion when he wakes up. Has never adjusted humidification. Finds it beneficial. DL is below      REVIEW OF SYSTEMS: Out of a complete 14 system review of symptoms, the patient complains only of the following symptoms, and all other reviewed systems are negative.  ESS 8  ALLERGIES: Allergies  Allergen Reactions   Oxycodone  Nausea And Vomiting and Other (See Comments)    Dizziness    HOME MEDICATIONS: Outpatient Medications Prior to Visit  Medication Sig Dispense Refill   aspirin  EC 81 MG EC tablet Take 1 tablet (81 mg total) by mouth daily. 30 tablet 0   atorvastatin  (LIPITOR) 40 MG tablet Take 1 tablet (40 mg total) by mouth daily at 6 PM. 30 tablet 0   irbesartan-hydrochlorothiazide (AVALIDE) 150-12.5 MG tablet Take 1 tablet by mouth daily.     meclizine  (ANTIVERT ) 12.5 MG tablet Take 1 tablet (12.5 mg total) by mouth 3 (three) times daily as needed for dizziness. 30 tablet 0   No facility-administered medications prior to visit.    PAST MEDICAL HISTORY: Past Medical History:  Diagnosis Date   CVA (cerebral vascular accident) (HCC)    Dysrhythmia    Hypertension    Kidney stone    Medical history non-contributory    OSA on CPAP     PAST SURGICAL HISTORY: Past Surgical History:  Procedure Laterality Date   EXTRACORPOREAL SHOCK WAVE LITHOTRIPSY Right 07/11/2020   Procedure: RIGHT EXTRACORPOREAL SHOCK WAVE LITHOTRIPSY (ESWL);  Surgeon: Samson Croak, MD;  Location: [facility];  Service: Urology;   Laterality: Right;   EXTRACORPOREAL SHOCK WAVE LITHOTRIPSY Right 02/23/2021   Procedure: EXTRACORPOREAL SHOCK WAVE LITHOTRIPSY (ESWL);  Surgeon: Trent Frizzle, MD;  Location: [facility];  Service: Urology;  Laterality: Right;    FAMILY HISTORY: Family History  Problem Relation Age of Onset   Heart attack Mother    Lung cancer Father    Cancer Sister    Sleep apnea Neg Hx     SOCIAL HISTORY: Social History   Socioeconomic History   Marital status: Married    Spouse name: Not on file   Number of children: Not on file   Years of education: Not on file   Highest education level: Not on file  Occupational History   Not on file  Tobacco Use   Smoking status: Never   Smokeless tobacco: Never  Vaping Use   Vaping status: Never Used  Substance and Sexual Activity   Alcohol use: No   Drug use: No   Sexual activity: Yes  Other Topics Concern   Not on file  Social History Narrative   Live alone.  Retired Research officer, political party.  Children grown-3, one local.  Caffeine: non caffeine.  Education:  HS.     Social Drivers of Corporate investment banker Strain: Not on file  Food Insecurity: Unknown (02/15/2018)   Hunger Vital Sign    Worried About Running Out of Food in the Last Year: Patient declined  Ran Out of Food in the Last Year: Patient declined  Transportation Needs: Unknown (02/15/2018)   PRAPARE - Administrator, Civil Service (Medical): Patient declined    Lack of Transportation (Non-Medical): Patient declined  Physical Activity: Not on file  Stress: Not on file  Social Connections: Not on file  Intimate Partner Violence: Unknown (02/15/2018)   Humiliation, Afraid, Rape, and Kick questionnaire    Fear of Current or Ex-Partner: Patient declined    Emotionally Abused: Patient declined    Physically Abused: Patient declined    Sexually Abused: Patient declined      PHYSICAL EXAM  There were no vitals filed for this visit.  There is no  height or weight on file to calculate BMI.  Generalized: Well developed, in no acute distress  Chest: Lungs clear to auscultation bilaterally  Neurological examination  Mentation: Alert oriented to time, place, history taking. Follows all commands speech and language fluent Cranial nerve II-XII: Facial symmetry noted Gait and station: Gait is normal.    DIAGNOSTIC DATA (LABS, IMAGING, TESTING) - I reviewed patient records, labs, notes, testing and imaging myself where available.  Lab Results  Component Value Date   WBC 5.2 07/02/2022   HGB 15.6 07/02/2022   HCT 46.0 07/02/2022   MCV 92.7 07/02/2022   PLT 159 07/02/2022      Component Value Date/Time   NA 137 07/02/2022 0758   K 3.3 (L) 07/02/2022 0758   CL 99 07/02/2022 0758   CO2 24 07/02/2022 0753   GLUCOSE 138 (H) 07/02/2022 0758   BUN 21 07/02/2022 0758   CREATININE 1.10 07/02/2022 0758   CALCIUM  9.2 07/02/2022 0753   PROT 7.3 07/02/2022 0753   ALBUMIN 3.9 07/02/2022 0753   AST 21 07/02/2022 0753   ALT 19 07/02/2022 0753   ALKPHOS 69 07/02/2022 0753   BILITOT 0.9 07/02/2022 0753   GFRNONAA >60 07/02/2022 0753   GFRAA >60 02/14/2018 1443   Lab Results  Component Value Date   CHOL 214 (H) 02/15/2018   HDL 34 (L) 02/15/2018   LDLCALC 150 (H) 02/15/2018   TRIG 148 02/15/2018   CHOLHDL 6.3 02/15/2018   Lab Results  Component Value Date   HGBA1C 5.8 (H) 02/15/2018      ASSESSMENT AND PLAN 73 y.o. year old male  has a past medical history of CVA (cerebral vascular accident) (HCC), Dysrhythmia, Hypertension, Kidney stone, Medical history non-contributory, and OSA on CPAP. here with:  OSA on CPAP  - CPAP compliance excellent - Good treatment of AHI  - Encourage patient to use CPAP nightly and > 4 hours each night - F/U in 1 year or sooner if needed    Clem Currier, MSN, NP-C 06/23/2023, 1:46 PM [facility] [address], [address] [place], [place] [address] [phone]

## 2023-06-24 ENCOUNTER — Ambulatory Visit (INDEPENDENT_AMBULATORY_CARE_PROVIDER_SITE_OTHER): Payer: Medicare Other | Admitting: Adult Health

## 2023-06-24 ENCOUNTER — Encounter: Payer: Self-pay | Admitting: Adult Health

## 2023-06-24 VITALS — BP 130/68 | HR 74 | Ht 72.0 in | Wt 192.2 lb

## 2023-06-24 DIAGNOSIS — G4733 Obstructive sleep apnea (adult) (pediatric): Secondary | ICD-10-CM | POA: Diagnosis not present

## 2023-06-24 NOTE — Patient Instructions (Signed)
 Continue using CPAP nightly and greater than 4 hours each night Repeat home sleep test Will adjust settings to see if that helps with bloating If your symptoms worsen or you develop new symptoms please let us  know.

## 2023-06-24 NOTE — Progress Notes (Signed)
 Community message to [employer] for change EPR 3.

## 2023-06-27 NOTE — Progress Notes (Signed)
 New, Fabiene Holt, RN; New, Bradley; Cain, Mitchell; Ziegler, Melissa; Tucker, Dolanda; 1 other Received, thank you!

## 2023-07-23 ENCOUNTER — Ambulatory Visit (INDEPENDENT_AMBULATORY_CARE_PROVIDER_SITE_OTHER): Admitting: Neurology

## 2023-07-23 DIAGNOSIS — G4733 Obstructive sleep apnea (adult) (pediatric): Secondary | ICD-10-CM | POA: Diagnosis not present

## 2023-07-23 DIAGNOSIS — I6381 Other cerebral infarction due to occlusion or stenosis of small artery: Secondary | ICD-10-CM

## 2023-07-24 NOTE — Progress Notes (Signed)
 [facility]  Ethan Reed 73 year old male January 07, 1951   HOME SLEEP TEST REPORT ( by Watch PAT)   STUDY DATE:  07-23-2023     ORDERING CLINICIAN:  Duwaine Russell, NP     CLINICAL INFORMATION/HISTORY:  This patient is a long time OSA patient and CPAP user with a remote history of thalamic stroke, followed here since 2020.  06/24/23:MM   Ethan Reed is a 73 y.o. male with a history of OSA on CPAP. Returns today for follow-up.  Overall he states that he is tolerating the CPAP well.  He states he is now adjusting his humidification which has helped with the dry mouth.  He states he has noticed some bloating in the morning.  He states that he is due for new machine.  Last sleep study was in 2020.   5-11 cm water pressure,  2 cm EPR, 95% 9.9 cm water, residual AHI of  1.6/h.     Epworth sleepiness score: 8 /24.   BMI: 26 kg/m   Neck Circumference: na   FINDINGS:   Sleep Summary:   Total Recording Time (hours, min):     7 hours 36 minutes   Total Sleep Time (hours, min):   7 hours 0 minutes              Percent REM (%): 44%                                      Respiratory Indices:   Calculated pAHI (per CMS guideline): 11.9/h                         REM pAHI:    19/h                                             NREM pAHI:   6/h                           Positional AHI:   The patient slept mostly in supine for 288 minutes associated with an AHI of 12.4.  In nonsupine sleep his AHI was 9.1/h following CMS guidelines.  Snoring level was 40 dB which is just the threshold of detection and only 3% of sleep time were supposedly associated with any snoring.  Oxygen Saturation Statistics:   Oxygen Saturation (%) Mean:       92%         O2 Saturation Range (%):    Between the nadir at 86 and a maximum of 98%                                   O2 Saturation (minutes) <89%:   2.6 m        Pulse Rate Statistics:   Pulse Mean (bpm): 63 bpm                 Pulse Range: Between 50 and 92 bpm.               IMPRESSION:  This HST confirms the presence of mild and  all obstructive sleep apnea with an AHI of 12 and a very strong REM sleep dependency.  This patient also presents with a very high proportion of REM sleep overall.  Continuation of CPAP treatment is therefore indicated.   RECOMMENDATION: The new CPAP autotitrator settings will be: 6 through 15 cm water pressure with 3 cm EPR, heated humidification and interface of patient's choice.  Revisit will take place with Megan Milliken after 60 and before 90 days of therapy. I issued an order for CPAP today.   Any patient should be cautioned not to drive, work at heights, or operate dangerous or heavy equipment when tired or sleepy.   Review of good sleep hygiene measures is provided to any sleep clinic patient and can be reiterated through online material- I we recommend the Guide to better Sleep   by the NIH.   Weight loss and Core Strength improvement is recommended for individuals with low muscle tone and/ or a BMI over 32.  The referring physician will be notified of the test results.       INTERPRETING PHYSICIAN:  Dedra Gores, MD  [facility] Sleep Board certified by The [employer] of Sleep Medicine and Diplomate of the [employer] of Sleep Medicine. Board certified In Neurology through the ABPN, Fellow of the [employer] of Neurology.

## 2023-08-01 ENCOUNTER — Encounter: Payer: Self-pay | Admitting: Adult Health

## 2023-08-02 NOTE — Procedures (Signed)
 [facility]  Ethan Reed 73 year old male January 07, 1951   HOME SLEEP TEST REPORT ( by Watch PAT)   STUDY DATE:  07-23-2023     ORDERING CLINICIAN:  Duwaine Russell, NP     CLINICAL INFORMATION/HISTORY:  This patient is a long time OSA patient and CPAP user with a remote history of thalamic stroke, followed here since 2020.  06/24/23:MM   Ethan Reed is a 73 y.o. male with a history of OSA on CPAP. Returns today for follow-up.  Overall he states that he is tolerating the CPAP well.  He states he is now adjusting his humidification which has helped with the dry mouth.  He states he has noticed some bloating in the morning.  He states that he is due for new machine.  Last sleep study was in 2020.   5-11 cm water pressure,  2 cm EPR, 95% 9.9 cm water, residual AHI of  1.6/h.     Epworth sleepiness score: 8 /24.   BMI: 26 kg/m   Neck Circumference: na   FINDINGS:   Sleep Summary:   Total Recording Time (hours, min):     7 hours 36 minutes   Total Sleep Time (hours, min):   7 hours 0 minutes              Percent REM (%): 44%                                      Respiratory Indices:   Calculated pAHI (per CMS guideline): 11.9/h                         REM pAHI:    19/h                                             NREM pAHI:   6/h                           Positional AHI:   The patient slept mostly in supine for 288 minutes associated with an AHI of 12.4.  In nonsupine sleep his AHI was 9.1/h following CMS guidelines.  Snoring level was 40 dB which is just the threshold of detection and only 3% of sleep time were supposedly associated with any snoring.  Oxygen Saturation Statistics:   Oxygen Saturation (%) Mean:       92%         O2 Saturation Range (%):    Between the nadir at 86 and a maximum of 98%                                   O2 Saturation (minutes) <89%:   2.6 m        Pulse Rate Statistics:   Pulse Mean (bpm): 63 bpm                 Pulse Range: Between 50 and 92 bpm.               IMPRESSION:  This HST confirms the presence of mild and  all obstructive sleep apnea with an AHI of 12 and a very strong REM sleep dependency.  This patient also presents with a very high proportion of REM sleep overall.  Continuation of CPAP treatment is therefore indicated.   RECOMMENDATION: The new CPAP autotitrator settings will be: 6 through 15 cm water pressure with 3 cm EPR, heated humidification and interface of patient's choice.  Revisit will take place with Megan Milliken after 60 and before 90 days of therapy. I issued an order for CPAP today.   Any patient should be cautioned not to drive, work at heights, or operate dangerous or heavy equipment when tired or sleepy.   Review of good sleep hygiene measures is provided to any sleep clinic patient and can be reiterated through online material- I we recommend the Guide to better Sleep   by the NIH.   Weight loss and Core Strength improvement is recommended for individuals with low muscle tone and/ or a BMI over 32.  The referring physician will be notified of the test results.       INTERPRETING PHYSICIAN:  Dedra Gores, MD  [facility] Sleep Board certified by The [employer] of Sleep Medicine and Diplomate of the [employer] of Sleep Medicine. Board certified In Neurology through the ABPN, Fellow of the [employer] of Neurology.

## 2023-08-05 ENCOUNTER — Ambulatory Visit: Payer: Self-pay | Admitting: Adult Health

## 2023-08-05 DIAGNOSIS — G4733 Obstructive sleep apnea (adult) (pediatric): Secondary | ICD-10-CM

## 2023-08-05 NOTE — Progress Notes (Signed)
Order sent to [employer] for new machine.

## 2023-08-06 NOTE — Telephone Encounter (Signed)
 RE: new machine autopap Received: Today New, Adine Neysa Nena GORMAN, RN; Joylene Carlean Jackson Avelina; Sheree Leveda Dollar, Dolanda; 1 other Received, Thank you!     Previous Messages    ----- Message ----- From: Neysa Nena GORMAN, RN Sent: 08/06/2023   7:18 AM EDT To: [name]; [name]; Ephraim Dollar* Subject: new machine autopap                            Good morning, new order in epic  Ethan Reed Male, 73 y.o., [birth year] MRN: [id number]  New machine autopap  [name]

## 2023-08-24 ENCOUNTER — Emergency Department (HOSPITAL_BASED_OUTPATIENT_CLINIC_OR_DEPARTMENT_OTHER)
Admission: EM | Admit: 2023-08-24 | Discharge: 2023-08-24 | Disposition: A | Discharge status: Home / Self Care | Attending: Emergency Medicine | Admitting: Emergency Medicine

## 2023-08-24 ENCOUNTER — Other Ambulatory Visit: Payer: Self-pay

## 2023-08-24 ENCOUNTER — Encounter (HOSPITAL_BASED_OUTPATIENT_CLINIC_OR_DEPARTMENT_OTHER): Payer: Self-pay | Admitting: *Deleted

## 2023-08-24 DIAGNOSIS — R319 Hematuria, unspecified: Secondary | ICD-10-CM | POA: Insufficient documentation

## 2023-08-24 DIAGNOSIS — Z7982 Long term (current) use of aspirin: Secondary | ICD-10-CM | POA: Insufficient documentation

## 2023-08-24 LAB — COMPREHENSIVE METABOLIC PANEL WITH GFR
ALT: 17 U/L (ref 0–44)
AST: 22 U/L (ref 15–41)
Albumin: 4.5 g/dL (ref 3.5–5.0)
Alkaline Phosphatase: 92 U/L (ref 38–126)
Anion gap: 8 (ref 5–15)
BUN: 18 mg/dL (ref 8–23)
CO2: 30 mmol/L (ref 22–32)
Calcium: 10.8 mg/dL — ABNORMAL HIGH (ref 8.9–10.3)
Chloride: 101 mmol/L (ref 98–111)
Creatinine, Ser: 1.22 mg/dL (ref 0.61–1.24)
GFR, Estimated: >60 mL/min (ref >60)
Glucose, Bld: 89 mg/dL (ref 70–99)
Potassium: 4.1 mmol/L (ref 3.5–5.1)
Sodium: 140 mmol/L (ref 135–145)
Total Bilirubin: 0.7 mg/dL (ref 0.0–1.2)
Total Protein: 8.1 g/dL (ref 6.5–8.1)

## 2023-08-24 LAB — URINALYSIS, ROUTINE W REFLEX MICROSCOPIC
Bacteria, UA: NONE SEEN
Bilirubin Urine: NEGATIVE
Glucose, UA: NEGATIVE mg/dL
Ketones, ur: NEGATIVE mg/dL
Leukocytes,Ua: NEGATIVE
Nitrite: NEGATIVE
Protein, ur: NEGATIVE mg/dL
Specific Gravity, Urine: <1.005 — ABNORMAL LOW (ref 1.005–1.030)
pH: 7 (ref 5.0–8.0)

## 2023-08-24 LAB — CBC WITH DIFFERENTIAL/PLATELET
Abs Immature Granulocytes: 0.02 K/uL (ref 0.00–0.07)
Basophils Absolute: 0 K/uL (ref 0.0–0.1)
Basophils Relative: 1 %
Eosinophils Absolute: 0.1 K/uL (ref 0.0–0.5)
Eosinophils Relative: 1 %
HCT: 47.4 % (ref 39.0–52.0)
Hemoglobin: 16.3 g/dL (ref 13.0–17.0)
Immature Granulocytes: 0 %
Lymphocytes Relative: 22 %
Lymphs Abs: 1.1 K/uL (ref 0.7–4.0)
MCH: 31.5 pg (ref 26.0–34.0)
MCHC: 34.4 g/dL (ref 30.0–36.0)
MCV: 91.5 fL (ref 80.0–100.0)
Monocytes Absolute: 0.4 K/uL (ref 0.1–1.0)
Monocytes Relative: 8 %
Neutro Abs: 3.4 K/uL (ref 1.7–7.7)
Neutrophils Relative %: 68 %
Platelets: 185 K/uL (ref 150–400)
RBC: 5.18 MIL/uL (ref 4.22–5.81)
RDW: 12.7 % (ref 11.5–15.5)
WBC: 4.9 K/uL (ref 4.0–10.5)
nRBC: 0 % (ref 0.0–0.2)

## 2023-08-24 LAB — PROTIME-INR
INR: 0.9 (ref 0.8–1.2)
Prothrombin Time: 13 s (ref 11.4–15.2)

## 2023-08-24 NOTE — ED Triage Notes (Signed)
 Patient to ED reporting blood noted in urine x 4 days. Hx of kidney stones, no pain at this time.

## 2023-08-24 NOTE — ED Provider Notes (Signed)
 [place] EMERGENCY DEPARTMENT AT [facility] Provider Note   CSN: [id number] Arrival date & time: 08/24/23  1445     Patient presents with: Hematuria   Ethan Reed is a 73 y.o. male.   HPI Patient reports he thought his urine looked a little dark, like there might be blood in it about a couple weeks ago.  At that time he thought maybe he was all dehydrated and tried increasing fluids.  It seemed that it improved some but then he noticed this again.  This week he reports he had a couple of episodes where there were actually little blood flecks in the urine.  After that the urine cleared again.  Patient denies any pain burning or urgency.  No flank pain.  No abdominal pain.  No nausea no vomiting.  No fever no chills.  Patient has made a follow-up appointment with his urologist Dr. Carolee however appointment is not until early September.  Patient reports a history of kidney stones but he has not recently had any kind of pain that he would attribute to a kidney stone.    Prior to Admission medications   Medication Sig Start Date End Date Taking? Authorizing Provider  aspirin  EC 81 MG EC tablet Take 1 tablet (81 mg total) by mouth daily. 02/16/18   Mikhail, Maryann, DO  atorvastatin  (LIPITOR) 40 MG tablet Take 1 tablet (40 mg total) by mouth daily at 6 PM. 02/15/18   Mikhail, Maryann, DO  irbesartan-hydrochlorothiazide (AVALIDE) 150-12.5 MG tablet Take 1 tablet by mouth daily. 04/30/20   [provider]  meclizine  (ANTIVERT ) 12.5 MG tablet Take 1 tablet (12.5 mg total) by mouth 3 (three) times daily as needed for dizziness. 07/02/22   Yolande Lamar BROCKS, MD    Allergies: Oxycodone     Review of Systems  Updated Vital Signs BP (!) 145/85 (BP Location: Left Arm)   Pulse 90   Temp 97.6 F (36.4 C) (Temporal)   Resp 14   SpO2 99%   Physical Exam Constitutional:      Comments: Well-nourished well-developed.  Alert nontoxic.  HENT:     Mouth/Throat:     Pharynx:  Oropharynx is clear.  Eyes:     Extraocular Movements: Extraocular movements intact.  Cardiovascular:     Rate and Rhythm: Normal rate and regular rhythm.  Pulmonary:     Effort: Pulmonary effort is normal.     Breath sounds: Normal breath sounds.  Abdominal:     General: There is no distension.     Palpations: Abdomen is soft.     Tenderness: There is no abdominal tenderness. There is no guarding.     Comments: No CVA tenderness.  Musculoskeletal:        General: No swelling or tenderness. Normal range of motion.     Right lower leg: No edema.     Left lower leg: No edema.  Skin:    General: Skin is warm and dry.  Neurological:     General: No focal deficit present.     Mental Status: He is oriented to person, place, and time.     Motor: No weakness.     Coordination: Coordination normal.  Psychiatric:        Mood and Affect: Mood normal.     (all labs ordered are listed, but only abnormal results are displayed) Labs Reviewed  URINALYSIS, ROUTINE W REFLEX MICROSCOPIC - Abnormal; Notable for the following components:      Result Value  Color, Urine COLORLESS (*)    Specific Gravity, Urine <1.005 (*)    Hgb urine dipstick LARGE (*)    All other components within normal limits  COMPREHENSIVE METABOLIC PANEL WITH GFR - Abnormal; Notable for the following components:   Calcium  10.8 (*)    All other components within normal limits  CBC WITH DIFFERENTIAL/PLATELET  PROTIME-INR    EKG: None  Radiology: No results found.   Procedures   Medications Ordered in the ED - No data to display                                  Medical Decision Making Amount and/or Complexity of Data Reviewed Labs: ordered.   Patient is alert nontoxic clinically well in appearance.  He has noted episodic blood in his urine.  No associated symptoms.  Will obtain urinalysis and basic lab work.  UA 0-5 RBC, large hemoglobin, 0-5 WBC, CBC normal with normal differential comprehensive  metabolic panel normal with normal GFR.  At this time patient's diagnostic workup is normal.  I suspect either superficial vessels in the bladder wall or possibly tumor or polyp.  I have discussed this with the patient.  He is aware that he needs to continue to pursue the follow-up with Dr. Carolee for ureteroscopy and other diagnostic tests as may be recommended by urology..  Patient voices understanding     Final diagnoses:  Hematuria, unspecified type    ED Discharge Orders     None          Armenta Canning, MD 08/24/23 1734

## 2023-08-24 NOTE — Discharge Instructions (Signed)
 1.  Follow-up with your urologist as soon as possible.  They will need to do further testing in the office to see if there is a tumor or polyp in your bladder. 2.  Return to the emergency department if you develop a fever, pain in your back or abdomen like a kidney stone or bladder infection.

## 2023-08-24 NOTE — ED Notes (Signed)
 Reviewed discharge instructions and follow up care with pt. Pt verbalized understanding and had no further questions. Pt exited ED without complications.

## 2023-09-26 ENCOUNTER — Other Ambulatory Visit: Payer: Self-pay | Admitting: Urology

## 2023-09-27 ENCOUNTER — Other Ambulatory Visit: Payer: Self-pay

## 2023-09-27 ENCOUNTER — Encounter (HOSPITAL_COMMUNITY): Payer: Self-pay | Admitting: Urology

## 2023-09-27 NOTE — Progress Notes (Signed)
 Attempted to obtain medical history via telephone, unable to reach at this time. HIPAA compliant voicemail message left requesting return call to pre surgical testing department.

## 2023-09-27 NOTE — Progress Notes (Addendum)
 For Anesthesia: PCP - Reena Duck, MD  Cardiologist - Dr. Victory Sharps  NEUROLOGIST: Dr. Chalice   Bowel Prep reminder: N/A  Chest x-ray - greater than 1 year EKG - greater than 1 year will obtain day of surgery Stress Test - 11/2005 ECHO - 01/2018 in [facility] Cardiac Cath - N/A Pacemaker/ICD device last checked: N/A Pacemaker orders received: N/A Device Rep notified: N/A  Spinal Cord Stimulator: N/A  Sleep Study - 07/23/23 in [facility] CPAP - Yes  Fasting Blood Sugar - N/A Checks Blood Sugar ___N/A__ times a day Date and result of last Hgb A1c-N/A  Blood Thinner Instructions: N/A Aspirin  Instructions: Yes Last Dose: 09/26/2023  Activity level: Able to exercise without chest pain and/or shortness of breath    Anesthesia review: OSA, HTN, history of stroke, Ectatic aorta   Patient denies shortness of breath, fever, cough and chest pain at PAT appointment   Patient verbalized understanding of instructions that were reviewed over the telephone.

## 2023-09-30 ENCOUNTER — Other Ambulatory Visit: Payer: Self-pay

## 2023-09-30 ENCOUNTER — Encounter (HOSPITAL_COMMUNITY): Payer: Self-pay | Admitting: Urology

## 2023-09-30 ENCOUNTER — Ambulatory Visit (HOSPITAL_BASED_OUTPATIENT_CLINIC_OR_DEPARTMENT_OTHER): Payer: Self-pay | Admitting: Physician Assistant

## 2023-09-30 ENCOUNTER — Ambulatory Visit (HOSPITAL_COMMUNITY): Payer: Self-pay | Admitting: Physician Assistant

## 2023-09-30 ENCOUNTER — Observation Stay (HOSPITAL_COMMUNITY)
Admission: RE | Admit: 2023-09-30 | Discharge: 2023-10-01 | Disposition: A | Discharge status: Home / Self Care | Attending: Urology | Admitting: Urology

## 2023-09-30 ENCOUNTER — Encounter (HOSPITAL_COMMUNITY)
Admission: RE | Disposition: A | Discharge status: Home / Self Care | Payer: Self-pay | Source: Home / Self Care | Attending: Urology

## 2023-09-30 DIAGNOSIS — E785 Hyperlipidemia, unspecified: Secondary | ICD-10-CM

## 2023-09-30 DIAGNOSIS — Z01818 Encounter for other preprocedural examination: Secondary | ICD-10-CM

## 2023-09-30 DIAGNOSIS — J45909 Unspecified asthma, uncomplicated: Secondary | ICD-10-CM | POA: Diagnosis not present

## 2023-09-30 DIAGNOSIS — Z79899 Other long term (current) drug therapy: Secondary | ICD-10-CM | POA: Diagnosis not present

## 2023-09-30 DIAGNOSIS — I10 Essential (primary) hypertension: Secondary | ICD-10-CM | POA: Diagnosis not present

## 2023-09-30 DIAGNOSIS — C679 Malignant neoplasm of bladder, unspecified: Principal | ICD-10-CM | POA: Insufficient documentation

## 2023-09-30 DIAGNOSIS — Z8673 Personal history of transient ischemic attack (TIA), and cerebral infarction without residual deficits: Secondary | ICD-10-CM | POA: Insufficient documentation

## 2023-09-30 DIAGNOSIS — I679 Cerebrovascular disease, unspecified: Secondary | ICD-10-CM

## 2023-09-30 DIAGNOSIS — D494 Neoplasm of unspecified behavior of bladder: Secondary | ICD-10-CM | POA: Diagnosis present

## 2023-09-30 DIAGNOSIS — K769 Liver disease, unspecified: Principal | ICD-10-CM

## 2023-09-30 HISTORY — DX: Hepatic fibrosis, unspecified: K74.00

## 2023-09-30 HISTORY — DX: Unspecified osteoarthritis, unspecified site: M19.90

## 2023-09-30 HISTORY — DX: Hemiplegia and hemiparesis following cerebral infarction affecting right dominant side: I69.351

## 2023-09-30 HISTORY — DX: Unspecified asthma, uncomplicated: J45.909

## 2023-09-30 HISTORY — DX: Depression, unspecified: F32.A

## 2023-09-30 HISTORY — DX: Other specified abnormal immunological findings in serum: R76.8

## 2023-09-30 HISTORY — DX: Personal history of colon polyps, unspecified: Z86.0100

## 2023-09-30 HISTORY — DX: Unspecified right bundle-branch block: I45.10

## 2023-09-30 HISTORY — PX: TRANSURETHRAL RESECTION OF BLADDER TUMOR: SHX2575

## 2023-09-30 HISTORY — DX: Personal history of other specified conditions: Z87.898

## 2023-09-30 HISTORY — DX: Unspecified hemorrhoids: K64.9

## 2023-09-30 HISTORY — DX: Diverticulosis of intestine, part unspecified, without perforation or abscess without bleeding: K57.90

## 2023-09-30 HISTORY — DX: Aortic ectasia, unspecified site: I77.819

## 2023-09-30 HISTORY — DX: Prediabetes: R73.03

## 2023-09-30 LAB — COMPREHENSIVE METABOLIC PANEL WITH GFR
ALT: 14 U/L (ref 0–44)
AST: 19 U/L (ref 15–41)
Albumin: 4.3 g/dL (ref 3.5–5.0)
Alkaline Phosphatase: 87 U/L (ref 38–126)
Anion gap: 12 (ref 5–15)
BUN: 21 mg/dL (ref 8–23)
CO2: 27 mmol/L (ref 22–32)
Calcium: 9.5 mg/dL (ref 8.9–10.3)
Chloride: 100 mmol/L (ref 98–111)
Creatinine, Ser: 1.08 mg/dL (ref 0.61–1.24)
GFR, Estimated: >60 mL/min (ref >60)
Glucose, Bld: 100 mg/dL — ABNORMAL HIGH (ref 70–99)
Potassium: 3.7 mmol/L (ref 3.5–5.1)
Sodium: 139 mmol/L (ref 135–145)
Total Bilirubin: 0.9 mg/dL (ref 0.0–1.2)
Total Protein: 7.7 g/dL (ref 6.5–8.1)

## 2023-09-30 LAB — CBC
HCT: 46.7 % (ref 39.0–52.0)
Hemoglobin: 15.6 g/dL (ref 13.0–17.0)
MCH: 31.3 pg (ref 26.0–34.0)
MCHC: 33.4 g/dL (ref 30.0–36.0)
MCV: 93.8 fL (ref 80.0–100.0)
Platelets: 179 K/uL (ref 150–400)
RBC: 4.98 MIL/uL (ref 4.22–5.81)
RDW: 12.9 % (ref 11.5–15.5)
WBC: 4.3 K/uL (ref 4.0–10.5)
nRBC: 0 % (ref 0.0–0.2)

## 2023-09-30 SURGERY — TURBT (TRANSURETHRAL RESECTION OF BLADDER TUMOR)
Anesthesia: General | Site: Bladder

## 2023-09-30 MED ORDER — ONDANSETRON HCL 4 MG/2ML IJ SOLN
INTRAMUSCULAR | Status: DC | PRN
Start: 2023-09-30 — End: 2023-09-30
  Administered 2023-09-30: 4 mg via INTRAVENOUS

## 2023-09-30 MED ORDER — ACETAMINOPHEN 325 MG PO TABS
650.0000 mg | ORAL_TABLET | ORAL | Status: DC | PRN
  Administered 2023-10-01: 650 mg via ORAL
  Filled 2023-09-30: qty 2

## 2023-09-30 MED ORDER — HYDROCHLOROTHIAZIDE 12.5 MG PO TABS
12.5000 mg | ORAL_TABLET | Freq: Every day | ORAL | Status: DC
  Administered 2023-10-01: 12.5 mg via ORAL
  Filled 2023-09-30: qty 1

## 2023-09-30 MED ORDER — FENTANYL CITRATE (PF) 100 MCG/2ML IJ SOLN
INTRAMUSCULAR | Status: AC
  Filled 2023-09-30: qty 2

## 2023-09-30 MED ORDER — ONDANSETRON HCL 4 MG/2ML IJ SOLN
INTRAMUSCULAR | Status: AC
  Filled 2023-09-30: qty 2

## 2023-09-30 MED ORDER — ONDANSETRON HCL 4 MG/2ML IJ SOLN: 4.0000 mg | INTRAMUSCULAR | Status: DC | PRN

## 2023-09-30 MED ORDER — SENNA 8.6 MG PO TABS
1.0000 | ORAL_TABLET | Freq: Two times a day (BID) | ORAL | Status: DC
  Administered 2023-09-30 – 2023-10-01 (×2): 8.6 mg via ORAL
  Filled 2023-09-30 (×2): qty 1

## 2023-09-30 MED ORDER — ORAL CARE MOUTH RINSE: 15.0000 mL | Freq: Once | OROMUCOSAL | Status: AC

## 2023-09-30 MED ORDER — PHENYLEPHRINE HCL-NACL 20-0.9 MG/250ML-% IV SOLN
INTRAVENOUS | Status: DC | PRN
  Administered 2023-09-30: 25 ug/min via INTRAVENOUS

## 2023-09-30 MED ORDER — IRBESARTAN-HYDROCHLOROTHIAZIDE 150-12.5 MG PO TABS: 1.0000 | ORAL_TABLET | Freq: Every day | ORAL | Status: DC

## 2023-09-30 MED ORDER — GEMCITABINE CHEMO FOR BLADDER INSTILLATION 2000 MG
2000.0000 mg | Freq: Once | INTRAVENOUS | Status: DC
  Filled 2023-09-30: qty 52.6

## 2023-09-30 MED ORDER — MORPHINE SULFATE (PF) 2 MG/ML IV SOLN: 2.0000 mg | INTRAVENOUS | Status: DC | PRN

## 2023-09-30 MED ORDER — PHENYLEPHRINE HCL (PRESSORS) 10 MG/ML IV SOLN
INTRAVENOUS | Status: DC | PRN
  Administered 2023-09-30 (×4): 80 ug via INTRAVENOUS
  Administered 2023-09-30: 160 ug via INTRAVENOUS
  Administered 2023-09-30: 80 ug via INTRAVENOUS

## 2023-09-30 MED ORDER — DOCUSATE SODIUM 100 MG PO CAPS
100.0000 mg | ORAL_CAPSULE | Freq: Two times a day (BID) | ORAL | Status: DC
  Administered 2023-09-30 – 2023-10-01 (×2): 100 mg via ORAL
  Filled 2023-09-30 (×2): qty 1

## 2023-09-30 MED ORDER — FENTANYL CITRATE PF 50 MCG/ML IJ SOSY: 25.0000 ug | PREFILLED_SYRINGE | INTRAMUSCULAR | Status: DC | PRN

## 2023-09-30 MED ORDER — CEFAZOLIN SODIUM-DEXTROSE 2-4 GM/100ML-% IV SOLN
2.0000 g | INTRAVENOUS | Status: AC
  Administered 2023-09-30: 2 g via INTRAVENOUS
  Filled 2023-09-30: qty 100

## 2023-09-30 MED ORDER — PROPOFOL 10 MG/ML IV BOLUS
INTRAVENOUS | Status: DC | PRN
  Administered 2023-09-30: 50 mg via INTRAVENOUS
  Administered 2023-09-30: 150 mg via INTRAVENOUS

## 2023-09-30 MED ORDER — ALBUMIN HUMAN 5 % IV SOLN: INTRAVENOUS | Status: DC | PRN

## 2023-09-30 MED ORDER — ZOLPIDEM TARTRATE 5 MG PO TABS: 5.0000 mg | ORAL_TABLET | Freq: Every evening | ORAL | Status: DC | PRN

## 2023-09-30 MED ORDER — LIDOCAINE HCL (PF) 2 % IJ SOLN
INTRAMUSCULAR | Status: AC
  Filled 2023-09-30: qty 5

## 2023-09-30 MED ORDER — DEXAMETHASONE SODIUM PHOSPHATE 10 MG/ML IJ SOLN
INTRAMUSCULAR | Status: DC | PRN
  Administered 2023-09-30: 8 mg via INTRAVENOUS

## 2023-09-30 MED ORDER — IRBESARTAN 150 MG PO TABS
150.0000 mg | ORAL_TABLET | Freq: Every day | ORAL | Status: DC
  Administered 2023-10-01: 150 mg via ORAL
  Filled 2023-09-30: qty 1

## 2023-09-30 MED ORDER — ROCURONIUM BROMIDE 10 MG/ML (PF) SYRINGE
PREFILLED_SYRINGE | INTRAVENOUS | Status: DC | PRN
  Administered 2023-09-30: 10 mg via INTRAVENOUS
  Administered 2023-09-30: 50 mg via INTRAVENOUS
  Administered 2023-09-30: 20 mg via INTRAVENOUS

## 2023-09-30 MED ORDER — LACTATED RINGERS IV SOLN: INTRAVENOUS | Status: DC

## 2023-09-30 MED ORDER — ACETAMINOPHEN 10 MG/ML IV SOLN
INTRAVENOUS | Status: DC | PRN
  Administered 2023-09-30: 1000 mg via INTRAVENOUS

## 2023-09-30 MED ORDER — SODIUM CHLORIDE 0.9 % IR SOLN
Status: DC | PRN
  Administered 2023-09-30 (×4): 3000 mL via INTRAVESICAL
  Administered 2023-09-30: 45000 mL via INTRAVESICAL
  Administered 2023-09-30: 3000 mL via INTRAVESICAL

## 2023-09-30 MED ORDER — ATORVASTATIN CALCIUM 40 MG PO TABS: 40.0000 mg | ORAL_TABLET | Freq: Every day | ORAL | Status: DC

## 2023-09-30 MED ORDER — TRIPLE ANTIBIOTIC 3.5-400-5000 EX OINT: 1.0000 | TOPICAL_OINTMENT | Freq: Three times a day (TID) | CUTANEOUS | Status: DC | PRN

## 2023-09-30 MED ORDER — DIPHENHYDRAMINE HCL 12.5 MG/5ML PO ELIX: 12.5000 mg | ORAL_SOLUTION | Freq: Four times a day (QID) | ORAL | Status: DC | PRN

## 2023-09-30 MED ORDER — HYDROCODONE-ACETAMINOPHEN 5-325 MG PO TABS: 1.0000 | ORAL_TABLET | ORAL | Status: DC | PRN

## 2023-09-30 MED ORDER — CHLORHEXIDINE GLUCONATE 0.12 % MT SOLN
15.0000 mL | Freq: Once | OROMUCOSAL | Status: AC
  Administered 2023-09-30: 15 mL via OROMUCOSAL

## 2023-09-30 MED ORDER — SUGAMMADEX SODIUM 200 MG/2ML IV SOLN
INTRAVENOUS | Status: AC
  Filled 2023-09-30: qty 6

## 2023-09-30 MED ORDER — ACETAMINOPHEN 10 MG/ML IV SOLN: 1000.0000 mg | Freq: Once | INTRAVENOUS | Status: DC | PRN

## 2023-09-30 MED ORDER — DROPERIDOL 2.5 MG/ML IJ SOLN: 0.6250 mg | Freq: Once | INTRAMUSCULAR | Status: DC | PRN

## 2023-09-30 MED ORDER — DIPHENHYDRAMINE HCL 50 MG/ML IJ SOLN: 12.5000 mg | Freq: Four times a day (QID) | INTRAMUSCULAR | Status: DC | PRN

## 2023-09-30 MED ORDER — OXYCODONE HCL 5 MG/5ML PO SOLN: 5.0000 mg | Freq: Once | ORAL | Status: DC | PRN

## 2023-09-30 MED ORDER — MECLIZINE HCL 25 MG PO TABS: 12.5000 mg | ORAL_TABLET | Freq: Three times a day (TID) | ORAL | Status: DC | PRN

## 2023-09-30 MED ORDER — ACETAMINOPHEN 10 MG/ML IV SOLN
INTRAVENOUS | Status: AC
  Filled 2023-09-30: qty 100

## 2023-09-30 MED ORDER — OXYBUTYNIN CHLORIDE 5 MG PO TABS
5.0000 mg | ORAL_TABLET | Freq: Three times a day (TID) | ORAL | Status: DC | PRN
  Filled 2023-09-30: qty 1

## 2023-09-30 MED ORDER — SODIUM CHLORIDE 0.9 % IR SOLN
Status: DC | PRN
  Administered 2023-09-30: 1000 mL

## 2023-09-30 MED ORDER — SODIUM CHLORIDE 0.9 % IV SOLN: INTRAVENOUS | Status: DC

## 2023-09-30 MED ORDER — FENTANYL CITRATE (PF) 100 MCG/2ML IJ SOLN
INTRAMUSCULAR | Status: DC | PRN
  Administered 2023-09-30: 25 ug via INTRAVENOUS
  Administered 2023-09-30 (×2): 50 ug via INTRAVENOUS

## 2023-09-30 MED ORDER — ACETAMINOPHEN 500 MG PO TABS: 1000.0000 mg | ORAL_TABLET | Freq: Once | ORAL | Status: DC

## 2023-09-30 MED ORDER — MIDAZOLAM HCL 2 MG/2ML IJ SOLN
INTRAMUSCULAR | Status: AC
  Filled 2023-09-30: qty 2

## 2023-09-30 MED ORDER — OXYCODONE HCL 5 MG PO TABS: 5.0000 mg | ORAL_TABLET | Freq: Once | ORAL | Status: DC | PRN

## 2023-09-30 MED ORDER — PROPOFOL 10 MG/ML IV BOLUS
INTRAVENOUS | Status: AC
  Filled 2023-09-30: qty 20

## 2023-09-30 MED ORDER — TRANEXAMIC ACID-NACL 1000-0.7 MG/100ML-% IV SOLN
1000.0000 mg | INTRAVENOUS | Status: AC
  Administered 2023-09-30: 1000 mg via INTRAVENOUS
  Filled 2023-09-30: qty 100

## 2023-09-30 MED ORDER — HYDROCODONE-ACETAMINOPHEN 5-325 MG PO TABS: 1.0000 | ORAL_TABLET | Freq: Four times a day (QID) | ORAL | 0 refills | Status: AC | PRN

## 2023-09-30 MED ORDER — LIDOCAINE HCL (CARDIAC) PF 100 MG/5ML IV SOSY
PREFILLED_SYRINGE | INTRAVENOUS | Status: DC | PRN
  Administered 2023-09-30: 40 mg via INTRAVENOUS

## 2023-09-30 MED ORDER — TRANEXAMIC ACID-NACL 1000-0.7 MG/100ML-% IV SOLN
1000.0000 mg | Freq: Once | INTRAVENOUS | Status: AC
  Administered 2023-09-30: 1000 mg via INTRAVENOUS
  Filled 2023-09-30: qty 100

## 2023-09-30 MED ORDER — DEXAMETHASONE SODIUM PHOSPHATE 10 MG/ML IJ SOLN
INTRAMUSCULAR | Status: AC
  Filled 2023-09-30: qty 1

## 2023-09-30 MED ORDER — SUGAMMADEX SODIUM 200 MG/2ML IV SOLN
INTRAVENOUS | Status: DC | PRN
  Administered 2023-09-30: 400 mg via INTRAVENOUS

## 2023-09-30 SURGICAL SUPPLY — 17 items
BAG URINE DRAIN 2000ML AR STRL (UROLOGICAL SUPPLIES) IMPLANT
BAG URO CATCHER STRL LF (MISCELLANEOUS) ×3 IMPLANT
CATH FOLEY 2WAY SLVR 5CC 18FR (CATHETERS) IMPLANT
CATH FOLEY 2WAY SLVR 5CC 20FR (CATHETERS) ×1 IMPLANT
DRAPE FOOT SWITCH (DRAPES) ×3 IMPLANT
ELECT REM PT RETURN 15FT ADLT (MISCELLANEOUS) IMPLANT
GLOVE BIO SURGEON STRL SZ7.5 (GLOVE) ×3 IMPLANT
GOWN STRL REUS W/ TWL XL LVL3 (GOWN DISPOSABLE) ×3 IMPLANT
KIT TURNOVER KIT A (KITS) ×3 IMPLANT
LOOP CUT BIPOLAR 24F LRG (ELECTROSURGICAL) ×1 IMPLANT
MANIFOLD NEPTUNE II (INSTRUMENTS) ×3 IMPLANT
PACK CYSTO (CUSTOM PROCEDURE TRAY) ×3 IMPLANT
PAD TELFA 2X3 NADH STRL (GAUZE/BANDAGES/DRESSINGS) IMPLANT
PLUG CATH AND CAP STRL 200 (CATHETERS) IMPLANT
SYRINGE TOOMEY IRRIG 70ML (MISCELLANEOUS) IMPLANT
TUBING CONNECTING 10 (TUBING) ×3 IMPLANT
TUBING UROLOGY SET (TUBING) IMPLANT

## 2023-09-30 NOTE — Progress Notes (Signed)
   09/30/23 2339  BiPAP/CPAP/SIPAP  $ Non-Invasive Ventilator  Non-Invasive Vent Set Up  BiPAP/CPAP/SIPAP Pt Type Adult  BiPAP/CPAP/SIPAP Resmed  Mask Type Full face mask (patient's home mask)  Dentures removed? Not applicable  Respiratory Rate 18 breaths/min  EPAP  (patient is on auto titration between 5 and 11)  Pressure Support 2 cmH20  FiO2 (%) 21 %  Patient Home Machine No  Patient Home Mask Yes  Patient Home Tubing No  Auto Titrate Yes  Minimum cmH2O 5 cmH2O  Maximum cmH2O 11 cmH2O  Device Plugged into RED Power Outlet Yes  BiPAP/CPAP /SiPAP Vitals  Pulse Rate 89  Resp 18  SpO2 92 %  Bilateral Breath Sounds Clear;Diminished  MEWS Score/Color  MEWS Score 0  MEWS Score Color Green

## 2023-09-30 NOTE — Discharge Instructions (Signed)

## 2023-09-30 NOTE — Transfer of Care (Signed)
 Immediate Anesthesia Transfer of Care Note  Patient: Ethan Reed  Procedure(s) Performed: TURBT (TRANSURETHRAL RESECTION OF BLADDER TUMOR) (Bladder)  Patient Location: PACU  Anesthesia Type:General  Level of Consciousness: drowsy and patient cooperative  Airway & Oxygen Therapy: Patient Spontanous Breathing  Post-op Assessment: Report given to RN and Post -op Vital signs reviewed and stable  Post vital signs: Reviewed and stable  Last Vitals:  Vitals Value Taken Time  BP 128/88 09/30/23 19:30  Temp 35.7 C 09/30/23 19:00  Pulse 83 09/30/23 19:42  Resp 10 09/30/23 19:42  SpO2 92 % 09/30/23 19:42  Vitals shown include unfiled device data.  Last Pain:  Vitals:   09/30/23 1344  TempSrc: Oral  PainSc: 0-No pain      Patients Stated Pain Goal: 3 (09/27/23 0934)  Complications: No notable events documented.

## 2023-09-30 NOTE — Anesthesia Procedure Notes (Signed)
 Procedure Name: Intubation Date/Time: 09/30/2023 4:13 PM  Performed by: Nada Corean CROME, CRNAPre-anesthesia Checklist: Emergency Drugs available, Suction available, Patient identified, Patient being monitored and Timeout performed Patient Re-evaluated:Patient Re-evaluated prior to induction Oxygen Delivery Method: Circle system utilized Preoxygenation: Pre-oxygenation with 100% oxygen Induction Type: IV induction Ventilation: Mask ventilation without difficulty Laryngoscope Size: Mac and 4 Grade View: Grade III Tube type: Oral Tube size: 7.5 mm Number of attempts: 1 Airway Equipment and Method: Stylet Placement Confirmation: ETT inserted through vocal cords under direct vision, positive ETCO2 and breath sounds checked- equal and bilateral Secured at: 23 cm Tube secured with: Tape Dental Injury: Teeth and Oropharynx as per pre-operative assessment

## 2023-09-30 NOTE — Anesthesia Preprocedure Evaluation (Addendum)
 Anesthesia Evaluation  Patient identified by MRN, date of birth, ID band Patient awake    Reviewed: Allergy & Precautions, NPO status , Patient's Chart, lab work & pertinent test results  Airway Mallampati: II  TM Distance: >3 FB Neck ROM: Full    Dental  (+) Teeth Intact, Dental Advisory Given   Pulmonary asthma , sleep apnea and Continuous Positive Airway Pressure Ventilation    breath sounds clear to auscultation       Cardiovascular hypertension, Pt. on medications + dysrhythmias  Rhythm:Regular Rate:Normal     Neuro/Psych  PSYCHIATRIC DISORDERS  Depression    TIACVA    GI/Hepatic negative GI ROS, Neg liver ROS,,,  Endo/Other  negative endocrine ROS    Renal/GU Renal disease     Musculoskeletal  (+) Arthritis ,    Abdominal   Peds  Hematology negative hematology ROS (+)   Anesthesia Other Findings   Reproductive/Obstetrics                              Anesthesia Physical Anesthesia Plan  ASA: 3  Anesthesia Plan: General   Post-op Pain Management: Tylenol  PO (pre-op)*   Induction: Intravenous  PONV Risk Score and Plan: 3 and Ondansetron , Midazolam  and Treatment may vary due to age or medical condition  Airway Management Planned: Oral ETT  Additional Equipment: None  Intra-op Plan:   Post-operative Plan: Extubation in OR  Informed Consent: I have reviewed the patients History and Physical, chart, labs and discussed the procedure including the risks, benefits and alternatives for the proposed anesthesia with the patient or authorized representative who has indicated his/her understanding and acceptance.     Dental advisory given  Plan Discussed with: CRNA  Anesthesia Plan Comments:          Anesthesia Quick Evaluation

## 2023-09-30 NOTE — H&P (Signed)
 CC/HPI: CC: Right ureteral calculus  HPI:  73 year old male went to the emergency department on 06/25/2020. This was for severe right-sided flank pain. He was found to have a 8 mm distal right ureteral calculus with upstream hydronephrosis. KUB confirms the stone is still present. Patient denies any current pain or discomfort. Denies any fever, chill, nausea, vomiting.   6511:67 73-year-old male who underwent right-sided ESWL for 8 mm distal stone that was easily visualized on KUB. This was done 10 days ago. Since his procedure he denies passage of large stone material. He has passed some sand like material as well as some small clots. He denies pain. He denies fevers and chills. He denies any changes to his voiding habits. On KUB imagin today there is a few stone fragments noted within the right distal ureter measuring between 2 mm and 4 mm. He is going on vacation next week and would like to continue with medical expulsive therapy.   01/27/2021  Patient passed 2 stones after seeing us  last time. He presents again with a right distal ureteral calculus that is about 7 mm in size. He has not had any pain since going to the emergency department on the first. He did have to go to the emergency department on the fourth for dizziness. He had started taking tamsulosin . Everything was reviewed by cardiology and he was cleared to go home. He denies any fever, chill, nausea, vomiting.   02/13/2021: Back today for repeat KUB in regards to continued surveillance of a previously identified obstructing distal right ureteral stone. Patient has been grossly asymptomatic since time of last office visit with out any noted interval stone material passage. He is not had a recurrence of right-sided pain or discomfort. He has had no dysuria or gross hematuria. No interval fevers or chills, nausea/vomiting. UA without microscopic abnormalities today, KUB continues to show previous identified distal right ureteral calculi  identified just lateral to the right sacral wing.   03/14/2021: Returns today after undergoing shockwave lithotripsy to treat a previously identified right ureteral calculi on 2/2. Pt has several stone pieces and brings those in today. He did not have significant pain or discomfort postoperatively. Outside of stone material passage he denies any new or worsening lower urinary tract symptoms from baseline. UA today is reassuring. He denies any postprocedure fevers or chills, nausea/vomiting.   08/28/2023: 73 year old man who presents today with 2 weeks of episodic gross hematuria. This is painless. He denies voiding complaints. He states he will sometimes see flecks of blood in his urine and at other times his urine is pink. He does have a history of stones but has no flank pain.   09/26/2023  Patient underwent CT hematuria protocol that showed a likely large 5.9 cm bladder tumor. Also had a small renal calculus. Continues to have some intermittent gross hematuria.     ALLERGIES: Oxycodone -Acetaminophen  - Dizziness    MEDICATIONS: Aspir 81  Atorvastatin  Calcium   Irbesartan -Hydrochlorothiazide   Oxycodone  Hcl  Zofran      GU PSH: ESWL - 2023, Right - 2022 Locm 300-399Mg /Ml Iodine,1Ml - 09/16/2023, 2020     NON-GU PSH: Visit Complexity (formerly GPC1X) - 08/28/2023     GU PMH: Gross hematuria - 09/16/2023, - 08/28/2023 Renal calculus - 2023 Ureteral calculus - 2023, - 2023 (Stable), - 2023, - 2022, - 2022 Ureteral obstruction secondary to calculous - 2023, (Stable), - 2023, - 2022 Encounter for Prostate Cancer screening - 2020 History of urolithiasis - 2020 Microscopic hematuria - 2020  NON-GU PMH: Arrhythmia Arthritis Asthma Hypercholesterolemia Hypertension Sleep Apnea Stroke/TIA    FAMILY HISTORY: Cancer - Father Heart Attack - Mother   SOCIAL HISTORY: Marital Status: Widowed Preferred Language: English Current Smoking Status: Patient has never smoked.   Tobacco Use  Assessment Completed: Used Tobacco in last 30 days? Has never drank.  Does not drink caffeine. Patient's occupation is/was retired.     Notes: 2 sons 1 daughter   REVIEW OF SYSTEMS:    GU Review Male:   Patient denies frequent urination, hard to postpone urination, burning/ pain with urination, get up at night to urinate, leakage of urine, stream starts and stops, trouble starting your stream, have to strain to urinate , erection problems, and penile pain.  Gastrointestinal (Upper):   Patient denies nausea, vomiting, and indigestion/ heartburn.  Gastrointestinal (Lower):   Patient denies diarrhea and constipation.  Constitutional:   Patient denies fever, night sweats, weight loss, and fatigue.  Skin:   Patient denies skin rash/ lesion and itching.  Eyes:   Patient denies blurred vision and double vision.  Ears/ Nose/ Throat:   Patient denies sore throat and sinus problems.  Hematologic/Lymphatic:   Patient denies easy bruising and swollen glands.  Cardiovascular:   Patient denies leg swelling and chest pains.  Respiratory:   Patient denies cough and shortness of breath.  Endocrine:   Patient denies excessive thirst.  Musculoskeletal:   Patient denies back pain and joint pain.  Neurological:   Patient denies headaches and dizziness.  Psychologic:   Patient denies depression and anxiety.   VITAL SIGNS: None   MULTI-SYSTEM PHYSICAL EXAMINATION:    Constitutional: Well-nourished. No physical deformities. Normally developed. Good grooming.  Gastrointestinal: No mass, no tenderness, no rigidity, non obese abdomen.  Eyes: Normal conjunctivae. Normal eyelids.  Musculoskeletal: Normal gait and station of head and neck.     Complexity of Data:  Source Of History:  Patient  Records Review:   Previous Doctor Records, Previous Patient Records   05/06/18  PSA  Total PSA 1.13 ng/mL    PROCEDURES:         Flexible Cystoscopy - 52000  Risks, benefits, and some of the potential complications  of the procedure were discussed at length with the patient including infection, bleeding, voiding discomfort, urinary retention, fever, chills, sepsis, and others. All questions were answered. Informed consent was obtained. Antibiotic prophylaxis was given. Sterile technique and intraurethral analgesia were used.  Meatus:  Normal size. Normal location. Normal condition.  Urethra:  No strictures.  External Sphincter:  Normal.  Verumontanum:  Normal.  Prostate:  Borderline obstructing. Mild hyperplasia.  Bladder Neck:  Non-obstructing.  Ureteral Orifices:  Left ureteral orifice visualized unable to visualize right ureteral orifice due to tumor  Bladder:  No trabeculation. Large papillary bladder tumor immediately at the bladder neck towards the right and posteriorly      The lower urinary tract was carefully examined. The procedure was well-tolerated and without complications. Antibiotic instructions were given. Instructions were given to call the office immediately for bloody urine, difficulty urinating, urinary retention, painful or frequent urination, fever, chills, nausea, vomiting or other illness. The patient stated that he understood these instructions and would comply with them.         Urinalysis w/Scope - 81001 Dipstick Dipstick Cont'd Micro  Color: Red Bilirubin: Invalid WBC/hpf: 0 - 5/hpf  Appearance: Turbid Ketones: Invalid RBC/hpf: >60/hpf  Specific Gravity: Invalid Blood: Invalid Bacteria: Mod (26-50/hpf)  pH: Invalid Protein: Invalid Cystals: NS (Not Seen)  Glucose: Invalid Urobilinogen: Invalid Casts: NS (Not Seen)    Nitrites: Invalid Trichomonas: Not Present    Leukocyte Esterase: Invalid Mucous: Not Present      Epithelial Cells: NS (Not Seen)      Yeast: NS (Not Seen)      Sperm: Not Present    Notes:  **Unspun micro only due to color interference**     ASSESSMENT:      ICD-10 Details  1 GU:   Gross hematuria - R31.0 Chronic, Stable  2   Bladder tumor/neoplasm -  D41.4 Undiagnosed New Problem   PLAN:           Document Letter(s):  Created for Patient: Clinical Summary         Notes:   Plan for transurethral resection of bladder tumor with instillation of gemcitabine . Risk benefits discussed including but not limited to bleeding, infection, injury to surrounding structures, bladder perforation, need for additional procedures, need for prolonged catheter.   CC: Dr. Verena        Next Appointment:      Next Appointment: 09/30/2023 03:00 PM    Appointment Type: Surgery     Location: [facility] [phone]    Provider: Sherwood Edison, III, M.D.    Reason for Visit: OP---WL--TURBT/ GEMCITABINE      Signed by Sherwood Edison, III, M.D. on 09/27/23 at 10:05 AM (EDT

## 2023-09-30 NOTE — Op Note (Signed)
 Operative Note  Preoperative diagnosis:  1.  Bladder tumor  Postoperative diagnosis: 1.  Bladder tumor--large  Procedure(s): 1.  Transurethral resection of bladder tumor--large  Surgeon: Sherwood Edison, MD  Assistants: None  Anesthesia: General  Complications: None immediate  EBL: 50 cc  Specimens: 1.  Bladder tumor 2.  Bladder tumor deeper resection  Drains/Catheters: 1.  20 French Foley catheter  Intraoperative findings: 1.  Normal anterior urethra 2.  Borderline obstructing prostate 3.  Bladder mucosa with very large right lateral wall tumor measuring at least 6 or 7 cm.  Entire tumor was resected superficially down to muscle fibers.  Appeared invasive and high-grade.  There were several areas of some deep resection but I did not see an obvious perforation and bladder I would water well at the end of the case completely full.  Left ureteral orifice was well away from resection but right ureteral orifice was involved with resection.  Tumor overlying the ureteral orifice  Indication: 73 year old male with a large bladder tumor presents for the previously mentioned operation.  Description of procedure:  The patient was identified and consent was obtained.  The patient was taken to the operating room and placed in the supine position.  The patient was placed under general anesthesia.  Perioperative antibiotics were administered.  The patient was placed in dorsal lithotomy.  Patient was prepped and draped in a standard sterile fashion and a timeout was performed.  a 83 French resectoscope with a visual obturator in place was advanced into the urethra and into the bladder.  Complete cystoscopy was performed With the findings noted above.  I exchanged for the bipolar working element and proceeded to resect the tumor.  There is a very large tumor that I carefully systematically resected for about 2 or 2-1/2 hours.  After resecting a lot of the tumor superficially I collected that  specimen.  I then continued to resect the deep portion of the tumor down to muscle fibers and sent that specimen separately which was also a large amount.  Fulguration of the resection bed was performed.  There was no active bleeding noted.  I carefully inspected the resection bed and there were several areas of deep resection in the muscle fibers but I did not see any obvious perforation.  I filled the bladder with water and there was no extravasation of fluid.  I inspected the area again with the bladder completely full and did not see an obvious perforation.  Given the multiple deep areas however I decided to leave a Foley catheter.  I withdrew the scope and placed a 20 French Foley catheter.  This concluded the operation.  Patient tolerated the procedure well  and was stable postoperatively  Plan: I am going to keep patient overnight for observation given the large resection and we are going late into the evening.  Likely be discharged tomorrow with Foley catheter which she will keep until follow-up at which point we will review pathology and perform voiding trial

## 2023-09-30 NOTE — Interval H&P Note (Signed)
 History and Physical Interval Note:  09/30/2023 3:50 PM  Ethan Reed  has presented today for surgery, with the diagnosis of BLADDER TUMOR.  The various methods of treatment have been discussed with the patient and family. After consideration of risks, benefits and other options for treatment, the patient has consented to  Procedure(s) with comments: TURBT (TRANSURETHRAL RESECTION OF BLADDER TUMOR) (N/A) INSTILLATION, BLADDER (N/A) - INSTILL INTRAVESICAL GEMCITABINE  as a surgical intervention.  The patient's history has been reviewed, patient examined, no change in status, stable for surgery.  I have reviewed the patient's chart and labs.  Questions were answered to the patient's satisfaction.     Sherwood JONETTA Edison, III

## 2023-10-01 ENCOUNTER — Encounter (HOSPITAL_COMMUNITY): Payer: Self-pay | Admitting: Urology

## 2023-10-01 DIAGNOSIS — C679 Malignant neoplasm of bladder, unspecified: Secondary | ICD-10-CM | POA: Diagnosis not present

## 2023-10-01 MED ORDER — HYOSCYAMINE SULFATE 0.125 MG PO TABS: 0.1250 mg | ORAL_TABLET | ORAL | 0 refills | Status: AC | PRN

## 2023-10-01 MED ORDER — CHLORHEXIDINE GLUCONATE CLOTH 2 % EX PADS
6.0000 | MEDICATED_PAD | Freq: Every day | CUTANEOUS | Status: DC
  Administered 2023-10-01: 6 via TOPICAL

## 2023-10-01 NOTE — Progress Notes (Signed)
 [facility] was able to locate glasses and returned to patient. Glasses found in plastic sleeve inside shadow chart. Glasses at bedside with patient.

## 2023-10-01 NOTE — Discharge Summary (Signed)
 .Patient ID: Ethan Reed MRN: [id number] DOB/AGE: 1951/01/07 73 y.o.  Admit date: 09/30/2023 Discharge date: 10/01/2023  Primary Care Physician:  Verena Mems, MD  Discharge Diagnoses:   Present on Admission:  Bladder tumor   Discharge Medications: Allergies as of 10/01/2023       Reactions   Oxycodone  Nausea And Vomiting, Other (See Comments)   Dizziness        Medication List     STOP taking these medications    aspirin  EC 81 MG tablet       TAKE these medications    atorvastatin  40 MG tablet Commonly known as: LIPITOR Take 1 tablet (40 mg total) by mouth daily at 6 PM.   HYDROcodone -acetaminophen  5-325 MG tablet Commonly known as: NORCO/VICODIN Take 1 tablet by mouth every 6 (six) hours as needed. 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   irbesartan -hydrochlorothiazide  150-12.5 MG tablet Commonly known as: AVALIDE Take 1 tablet by mouth daily.   meclizine  12.5 MG tablet Commonly known as: ANTIVERT  Take 1 tablet (12.5 mg total) by mouth 3 (three) times daily as needed for dizziness.   NON FORMULARY Pt uses a c-pap nightly         Significant Diagnostic Studies:  No results found.  Brief H and P: For complete details please refer to admission H&P, but in brief, patient is a 73 year old male presenting to [facility] for scheduled cystoscopy with TURBT with Dr. Carolee.  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  Foley catheter will remain in place until patient can follow-up in clinic for voiding trial and review of path information.  Patient plan was reviewed with husband and wife.  All questions were answered to their satisfaction.  Patient will discharge home with full-size collection bag  and leg bag.  Hospital Course:  Principal Problem:   Bladder tumor   Day of Discharge BP 133/74 (BP Location: Right Arm)   Pulse 81   Temp 98.3 F (36.8 C) (Oral)   Resp (!) 22   Ht 6' (1.829 m)   Wt 85.3 kg   SpO2 98%   BMI 25.50 kg/m   Results for orders placed or performed during the hospital encounter of 09/30/23 (from the past 24 hours)  Comprehensive metabolic panel per protocol     Status: Abnormal   Collection Time: 09/30/23  1:57 PM  Result Value Ref Range   Sodium 139 135 - 145 mmol/L   Potassium 3.7 3.5 - 5.1 mmol/L   Chloride 100 98 - 111 mmol/L   CO2 27 22 - 32 mmol/L   Glucose, Bld 100 (H) 70 - 99 mg/dL   BUN 21 8 - 23 mg/dL   Creatinine, Ser 8.91 0.61 - 1.24 mg/dL   Calcium  9.5 8.9 - 10.3 mg/dL   Total Protein 7.7 6.5 - 8.1 g/dL   Albumin  4.3 3.5 - 5.0 g/dL   AST 19 15 - 41 U/L   ALT 14 0 - 44 U/L   Alkaline Phosphatase 87 38 - 126 U/L   Total Bilirubin 0.9 0.0 - 1.2 mg/dL   GFR, Estimated >39 >39 mL/min   Anion gap 12 5 - 15  CBC per protocol     Status:  None   Collection Time: 09/30/23  1:57 PM  Result Value Ref Range   WBC 4.3 4.0 - 10.5 K/uL   RBC 4.98 4.22 - 5.81 MIL/uL   Hemoglobin 15.6 13.0 - 17.0 g/dL   HCT 53.2 60.9 - 47.9 %   MCV 93.8 80.0 - 100.0 fL   MCH 31.3 26.0 - 34.0 pg   MCHC 33.4 30.0 - 36.0 g/dL   RDW 87.0 88.4 - 84.4 %   Platelets 179 150 - 400 K/uL   nRBC 0.0 0.0 - 0.2 %    Physical Exam: General: Alert and awake oriented x3 not in any acute distress. CVS: no cyanosis Chest: normal rate and work of breathing Abdomen: soft nontender, non-distended   Disposition: Home.  Transport with family  Diet: Regular  Activity: As tolerated   TESTS THAT NEED FOLLOW-UP  Pathology report to be reviewed in the clinic within 1 week  DISCHARGE FOLLOW-UP    Time spent on Discharge:  A total of 41 minutes was spent in planning, education, and discharge.    Case and plan reviewed with Dr. Carolee  Signed: OLE ORN  Lacrystal Barbe 10/01/2023, 10:31 AM

## 2023-10-01 NOTE — Progress Notes (Signed)
 Discharge instructions and AVS reviewed w/ wife and spouse. Foley catheter education and extra supplies given to patient and spouse.

## 2023-10-01 NOTE — Anesthesia Postprocedure Evaluation (Signed)
 Anesthesia Post Note  Patient: Ethan Reed  Procedure(s) Performed: TURBT (TRANSURETHRAL RESECTION OF BLADDER TUMOR) (Bladder)     Patient location during evaluation: PACU Anesthesia Type: General Level of consciousness: awake and alert Pain management: pain level controlled Vital Signs Assessment: post-procedure vital signs reviewed and stable Respiratory status: spontaneous breathing, nonlabored ventilation, respiratory function stable and patient connected to nasal cannula oxygen Cardiovascular status: blood pressure returned to baseline and stable Postop Assessment: no apparent nausea or vomiting Anesthetic complications: no   No notable events documented.  Last Vitals:  Vitals:   10/01/23 0518 10/01/23 0948  BP: 116/71 133/74  Pulse: 81 81  Resp: 16 (!) 22  Temp: 36.9 C 36.8 C  SpO2: 96% 98%    Last Pain:  Vitals:   10/01/23 0948  TempSrc: Oral  PainSc:                  Thom JONELLE Peoples

## 2023-10-02 LAB — SURGICAL PATHOLOGY

## 2023-10-10 ENCOUNTER — Telehealth: Payer: Self-pay | Admitting: Radiation Oncology

## 2023-10-10 NOTE — Telephone Encounter (Signed)
LVM to schedule CON with Dr. Kinard 

## 2023-10-16 ENCOUNTER — Telehealth: Payer: Self-pay | Admitting: Radiation Oncology

## 2023-10-16 NOTE — Telephone Encounter (Signed)
 LVM for pt and his wife to c/b to schedule consult with Dr. Shannon

## 2023-11-06 ENCOUNTER — Telehealth: Payer: Self-pay | Admitting: Radiation Oncology

## 2023-11-06 NOTE — Telephone Encounter (Signed)
 10/15 pt called to cancel his appts/due to seeking care at another facility - [facility] in [place].  Left voicemail with Dr. Margot office, so they are aware.

## 2023-11-11 ENCOUNTER — Ambulatory Visit

## 2023-11-11 ENCOUNTER — Ambulatory Visit: Admitting: Radiation Oncology
